# Patient Record
Sex: Male | Born: 1995 | Race: White | Hispanic: No | Smoking: Former smoker
Health system: Southern US, Community
[De-identification: ages and names within clinical notes are randomized; demographics above are authoritative.]

## PROBLEM LIST (undated history)

## (undated) ENCOUNTER — Emergency Department (HOSPITAL_COMMUNITY): Admission: EM | Payer: 59 | Source: Home / Self Care

## (undated) ENCOUNTER — Emergency Department (HOSPITAL_COMMUNITY): Payer: 59 | Source: Home / Self Care

---
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
## 2017-10-11 ENCOUNTER — Emergency Department (HOSPITAL_COMMUNITY)
Admission: EM | Admit: 2017-10-11 | Discharge: 2017-10-11 | Disposition: A | Discharge status: Home / Self Care | Payer: 59 | Attending: Emergency Medicine | Admitting: Emergency Medicine

## 2017-10-11 ENCOUNTER — Encounter (HOSPITAL_COMMUNITY): Payer: Self-pay | Admitting: Emergency Medicine

## 2017-10-11 ENCOUNTER — Emergency Department (HOSPITAL_COMMUNITY): Payer: 59

## 2017-10-11 DIAGNOSIS — R42 Dizziness and giddiness: Secondary | ICD-10-CM | POA: Insufficient documentation

## 2017-10-11 DIAGNOSIS — R0602 Shortness of breath: Secondary | ICD-10-CM | POA: Diagnosis not present

## 2017-10-11 DIAGNOSIS — R200 Anesthesia of skin: Secondary | ICD-10-CM | POA: Insufficient documentation

## 2017-10-11 DIAGNOSIS — R202 Paresthesia of skin: Secondary | ICD-10-CM

## 2017-10-11 DIAGNOSIS — F17210 Nicotine dependence, cigarettes, uncomplicated: Secondary | ICD-10-CM | POA: Insufficient documentation

## 2017-10-11 LAB — CBC WITH DIFFERENTIAL/PLATELET
Basophils Absolute: 0 10*3/uL (ref 0.0–0.1)
Basophils Relative: 0 %
Eosinophils Absolute: 0 10*3/uL (ref 0.0–0.7)
Eosinophils Relative: 0 %
HCT: 46.9 % (ref 39.0–52.0)
Hemoglobin: 16.5 g/dL (ref 13.0–17.0)
Lymphocytes Relative: 9 %
Lymphs Abs: 1.4 10*3/uL (ref 0.7–4.0)
MCH: 32.4 pg (ref 26.0–34.0)
MCHC: 35.2 g/dL (ref 30.0–36.0)
MCV: 92 fL (ref 78.0–100.0)
Monocytes Absolute: 2 10*3/uL — ABNORMAL HIGH (ref 0.1–1.0)
Monocytes Relative: 13 %
Neutro Abs: 11.8 10*3/uL — ABNORMAL HIGH (ref 1.7–7.7)
Neutrophils Relative %: 78 %
Platelets: 245 10*3/uL (ref 150–400)
RBC: 5.1 MIL/uL (ref 4.22–5.81)
RDW: 12.5 % (ref 11.5–15.5)
WBC: 15.3 10*3/uL — ABNORMAL HIGH (ref 4.0–10.5)

## 2017-10-11 LAB — COMPREHENSIVE METABOLIC PANEL
ALT: 23 U/L (ref 0–44)
AST: 25 U/L (ref 15–41)
Albumin: 5.4 g/dL — ABNORMAL HIGH (ref 3.5–5.0)
Alkaline Phosphatase: 69 U/L (ref 38–126)
Anion gap: 15 (ref 5–15)
BUN: 11 mg/dL (ref 6–20)
CO2: 22 mmol/L (ref 22–32)
Calcium: 11.1 mg/dL — ABNORMAL HIGH (ref 8.9–10.3)
Chloride: 106 mmol/L (ref 98–111)
Creatinine, Ser: 1.06 mg/dL (ref 0.61–1.24)
GFR calc Af Amer: >60 mL/min (ref >60)
GFR calc non Af Amer: >60 mL/min (ref >60)
Glucose, Bld: 113 mg/dL — ABNORMAL HIGH (ref 70–99)
Potassium: 3.5 mmol/L (ref 3.5–5.1)
Sodium: 143 mmol/L (ref 135–145)
Total Bilirubin: 1.4 mg/dL — ABNORMAL HIGH (ref 0.3–1.2)
Total Protein: 9.1 g/dL — ABNORMAL HIGH (ref 6.5–8.1)

## 2017-10-11 LAB — ETHANOL: Alcohol, Ethyl (B): <10 mg/dL (ref <10)

## 2017-10-11 LAB — D-DIMER, QUANTITATIVE (NOT AT ARMC): D DIMER QUANT: 0.28 ug{FEU}/mL (ref 0.00–0.50)

## 2017-10-11 LAB — POCT I-STAT TROPONIN I: TROPONIN I, POC: 0 ng/mL (ref 0.00–0.08)

## 2017-10-11 LAB — LIPASE, BLOOD: Lipase: 24 U/L (ref 11–51)

## 2017-10-11 MED ORDER — SODIUM CHLORIDE 0.9 % IV BOLUS
1000.0000 mL | Freq: Once | INTRAVENOUS | Status: AC
  Administered 2017-10-11: 1000 mL via INTRAVENOUS

## 2017-10-11 NOTE — ED Notes (Signed)
ISTAT result not crossing over, patient Troponin resulted 0.00

## 2017-10-11 NOTE — ED Provider Notes (Signed)
[facility]-EMERGENCY DEPT Provider Note   CSN: [id number] Arrival date & time: 10/11/17  7829     History   Chief Complaint Chief Complaint  Patient presents with  . Dizziness  . arm tingling  . Shortness of Breath    HPI Willie Lee is a 22 y.o. male here presenting with dizziness, headaches, numbness, chest pain, shortness of breath.  Patient states that he has been lightheaded and dizzy for the last 3 to 4 weeks.  Patient states that he feels dizzy when he stands up and felt like he is on passed out.  Denies any trouble walking or actual falls.  Patient does admit to drinking some beers occasionally but stopped drinking about a week ago.  Patient also states that he has poor appetite and has not been eating and drinking much but had no vomiting.  He also has some chest pain and subjective shortness of breath and felt like it is hard for him to catch his breath for the last several days.  Moreover since yesterday, he noticed intermittent numbness and tingling in the bilateral hands and forearms.  Patient states that at one point, he had trouble feeling the tips of his fingers and was concerned so came to the ED for evaluation.  Denies any recent travel history of blood clots.  Patient does admit to drinking some alcohol as well as doing some marijuana occasionally. Denies any IV drug use. Patient is otherwise healthy, not taking any medicines regularly.   The history is provided by the patient.    History reviewed. No pertinent past medical history.  There are no active problems to display for this patient.   History reviewed. No pertinent surgical history.      Home Medications    Prior to Admission medications   Medication Sig Start Date End Date Taking? Authorizing Provider  ibuprofen (ADVIL,MOTRIN) 200 MG tablet Take 600 mg by mouth daily as needed for moderate pain.   Yes [provider]  naproxen (NAPROSYN) 500 MG tablet Take 1 tablet  (500 mg total) by mouth 2 (two) times daily. Patient not taking: Reported on 10/11/2017 10/11/14   Jaynie Crumble, PA-C    Family History No family history on file.  Social History Social History   Tobacco Use  . Smoking status: Current Every Day Smoker    Packs/day: 0.50    Types: Cigarettes  . Smokeless tobacco: Never Used  Substance Use Topics  . Alcohol use: Yes  . Drug use: Yes    Types: Marijuana     Allergies   Patient has no known allergies.   Review of Systems Review of Systems  Respiratory: Positive for shortness of breath.   Neurological: Positive for dizziness.  All other systems reviewed and are negative.    Physical Exam Updated Vital Signs BP (!) 142/80   Pulse 65   Temp 98.6 F (37 C) (Oral)   Resp 19   SpO2 98%   Physical Exam  Constitutional: He is oriented to person, place, and time.  Slightly anxious, dehydrated   HENT:  Head: Normocephalic.  MM dry   Eyes: Pupils are equal, round, and reactive to light. EOM are normal.  No obvious nystagmus   Neck: Normal range of motion. Neck supple.  Cardiovascular: Normal rate and regular rhythm.  Pulmonary/Chest: Effort normal and breath sounds normal.  Abdominal: Soft. Bowel sounds are normal.  Musculoskeletal: Normal range of motion.       Right lower leg:  Normal.       Left lower leg: Normal.  Neurological: He is alert and oriented to person, place, and time.  CN 2- 12 intact. Nl strength throughout. Nl finger to nose throughout. Nl gait. ? Slightly dec sensation bilateral hands to the elbow in the glove distribution. 2+ radial pulses, nl capillary refill   Skin: Skin is warm. Capillary refill takes less than 2 seconds.  Psychiatric: He has a normal mood and affect. His behavior is normal.  Nursing note and vitals reviewed.    ED Treatments / Results  Labs (all labs ordered are listed, but only abnormal results are displayed) Labs Reviewed  CBC WITH DIFFERENTIAL/PLATELET - Abnormal;  Notable for the following components:      Result Value   WBC 15.3 (*)    Neutro Abs 11.8 (*)    Monocytes Absolute 2.0 (*)    All other components within normal limits  COMPREHENSIVE METABOLIC PANEL - Abnormal; Notable for the following components:   Glucose, Bld 113 (*)    Calcium 11.1 (*)    Total Protein 9.1 (*)    Albumin 5.4 (*)    Total Bilirubin 1.4 (*)    All other components within normal limits  D-DIMER, QUANTITATIVE (NOT AT [facility])  ETHANOL  LIPASE, BLOOD  I-STAT TROPONIN, ED  POCT I-STAT TROPONIN I    EKG EKG Interpretation  Date/Time:  Monday October 11 2017 09:36:21 EDT Ventricular Rate:  99 PR Interval:    QRS Duration: 74 QT Interval:  357 QTC Calculation: 459 R Axis:   71 Text Interpretation:  Sinus rhythm LVH by voltage Repol abnrm, global ischemia, diffuse leads No previous ECGs available Confirmed by Richardean Canal [phone]) on 10/11/2017 10:55:05 AM   Radiology Dg Chest 2 View  Result Date: 10/11/2017 CLINICAL DATA:  Dizziness for approx 1 month with new onset arms tingling, sob over last few days, recently quit smoking EXAM: CHEST - 2 VIEW COMPARISON:  08/19/2005 FINDINGS: Normal heart, mediastinum and hila. The lungs are clear.  No pleural effusion or pneumothorax. Skeletal structures are within normal limits. IMPRESSION: Normal chest radiographs. Electronically Signed   By: Amie Portland M.D.   On: 10/11/2017 09:46   Ct Head Wo Contrast  Result Date: 10/11/2017 CLINICAL DATA:  Vertigo EXAM: CT HEAD WITHOUT CONTRAST TECHNIQUE: Contiguous axial images were obtained from the base of the skull through the vertex without intravenous contrast. COMPARISON:  None. FINDINGS: Brain: No evidence of acute infarction, hemorrhage, hydrocephalus, extra-axial collection or mass lesion/mass effect. Vascular: No hyperdense vessel or unexpected calcification. Skull: No osseous abnormality. Sinuses/Orbits: Visualized paranasal sinuses are clear. Visualized mastoid sinuses are  clear. Visualized orbits demonstrate no focal abnormality. Other: None IMPRESSION: No acute intracranial pathology. Electronically Signed   By: Elige Ko   On: 10/11/2017 12:32    Procedures Procedures (including critical care time)  Medications Ordered in ED Medications  sodium chloride 0.9 % bolus 1,000 mL (1,000 mLs Intravenous New Bag/Given 10/11/17 1148)     Initial Impression / Assessment and Plan / ED Course  I have reviewed the triage vital signs and the nursing notes.  Pertinent labs & imaging results that were available during my care of the patient were reviewed by me and considered in my medical decision making (see chart for details).     Willie Lee is a 22 y.o. male here with dizziness, light headedness, numbness, shortness of breath. He has constellation of symptoms currently. Consider new onset diabetes vs brain mass  vs alcohol withdrawal vs orthostasis. Will check labs, CT head. Will hydrate and reassess.   1:07 PM WBC 15. But CT head and CXR clear. ETOH negative. Feels better with IVF. I don't know why he has parethesias that seemed to resolve. Glucose 113. Stable for discharge. Told him to avoid drinking alcohol and smoking cigarettes.    Final Clinical Impressions(s) / ED Diagnoses   Final diagnoses:  None    ED Discharge Orders    None       Charlynne Pander, MD 10/11/17 1308

## 2017-10-11 NOTE — ED Triage Notes (Signed)
Pt reports that for couple days he been having dizziness and lightheadedness. Yesterday arournd 5pm started having tingling in bilat hands and arms up to elbows, with SOB.

## 2017-10-11 NOTE — Discharge Instructions (Addendum)
Stay hydrated.   Avoid alcohol and smoking cigarettes.   See neurology if you have worse numbness in your hands   See your doctor  Return to ER if you have worse numbness, weakness, headaches, trouble breathing.

## 2018-01-05 ENCOUNTER — Other Ambulatory Visit: Payer: Self-pay

## 2018-01-05 ENCOUNTER — Encounter (HOSPITAL_COMMUNITY): Payer: Self-pay

## 2018-01-05 ENCOUNTER — Emergency Department (HOSPITAL_COMMUNITY)
Admission: EM | Admit: 2018-01-05 | Discharge: 2018-01-05 | Disposition: A | Discharge status: Home / Self Care | Payer: 59 | Attending: Emergency Medicine | Admitting: Emergency Medicine

## 2018-01-05 DIAGNOSIS — Z5321 Procedure and treatment not carried out due to patient leaving prior to being seen by health care provider: Secondary | ICD-10-CM | POA: Insufficient documentation

## 2018-01-05 DIAGNOSIS — R5383 Other fatigue: Secondary | ICD-10-CM | POA: Diagnosis not present

## 2018-01-05 NOTE — ED Notes (Signed)
Patient approached the nurse's station stating that he would rather just leave instead of waiting for testing. Patient seen leaving the emergency department by multiple staff members.

## 2018-01-05 NOTE — ED Triage Notes (Signed)
Pt states that he recently quit smoking, and has been coughing up dark sputum. Pt states that today he noticed some brighter red in his phlegm. Pt also reports fatigue.

## 2018-01-11 DIAGNOSIS — R42 Dizziness and giddiness: Secondary | ICD-10-CM | POA: Diagnosis not present

## 2018-01-11 DIAGNOSIS — F411 Generalized anxiety disorder: Secondary | ICD-10-CM | POA: Diagnosis not present

## 2018-01-11 DIAGNOSIS — R5383 Other fatigue: Secondary | ICD-10-CM | POA: Diagnosis not present

## 2018-01-11 DIAGNOSIS — R7309 Other abnormal glucose: Secondary | ICD-10-CM | POA: Diagnosis not present

## 2018-01-11 DIAGNOSIS — J988 Other specified respiratory disorders: Secondary | ICD-10-CM | POA: Diagnosis not present

## 2018-01-11 DIAGNOSIS — R202 Paresthesia of skin: Secondary | ICD-10-CM | POA: Diagnosis not present

## 2018-02-10 DIAGNOSIS — D72829 Elevated white blood cell count, unspecified: Secondary | ICD-10-CM | POA: Diagnosis not present

## 2018-03-14 ENCOUNTER — Ambulatory Visit: Payer: 59 | Admitting: Internal Medicine

## 2018-03-14 ENCOUNTER — Encounter: Payer: Self-pay | Admitting: Internal Medicine

## 2018-03-14 ENCOUNTER — Ambulatory Visit (INDEPENDENT_AMBULATORY_CARE_PROVIDER_SITE_OTHER)
Admission: RE | Admit: 2018-03-14 | Discharge: 2018-03-14 | Disposition: A | Discharge status: Home / Self Care | Payer: 59 | Source: Ambulatory Visit | Attending: Internal Medicine | Admitting: Internal Medicine

## 2018-03-14 VITALS — BP 114/64 | HR 94 | Ht 70.0 in | Wt 188.6 lb

## 2018-03-14 DIAGNOSIS — R053 Chronic cough: Secondary | ICD-10-CM

## 2018-03-14 DIAGNOSIS — R0789 Other chest pain: Secondary | ICD-10-CM | POA: Diagnosis not present

## 2018-03-14 DIAGNOSIS — R05 Cough: Secondary | ICD-10-CM

## 2018-03-14 NOTE — Progress Notes (Signed)
Willie Lee, male    DOB: July 25, 1995,    MRN: [id number]   Brief patient profile:  23 yowm very healthy kid, good runner and swimmer stopped age 23 when started smoking cigs/MJ and Sept 2019 tried to run 2 miles then after running felt tingly /light headed > ER 10/11/17 clinical dx dedhydration and tingling went away but then chonic cough with grey mucus rx zpak /blood work "ok" and grey mucus only resolved transiently and despite the decision to stop cigarettes 10/12/17 continued with dark mucus until stopped MJ last week in Feb 2019 and mucus less dark since, occ slt blood streak so referred to pulmonary clinic 03/14/2018 by Dr   Willie Lee      History of Present Illness  03/14/2018  Pulmonary/ 1st office eval/Willie Lee  Chief Complaint  Patient presents with  . Pulmonary Consult    Referred by Dr. Farris Lee. Pt c/o cough with dark grey sputum x 4 months. He c/o CP off and on since Jan 2020- mainly occurs in the am when he coughs up sputum.   Dyspnea:  Jumpy jacks q am / no real aerobics  Cough: mucus is still grey esp in am x one tsb but no longer bloody but assoc with bilateral ant cp with coughing fits  Sleep: able to sleep on side /  flat bed with one pillow SABA use: none    No obvious day to day or daytime variability or assoc excess/ purulent sputum or mucus plugs or hemoptysis or   chest tightness, subjective wheeze or overt sinus or hb symptoms.   Sleeping as above  without nocturnal  or early am exacerbation  of respiratory  c/o's or need for noct saba. Also denies any obvious fluctuation of symptoms with weather or environmental changes or other aggravating or alleviating factors except as outlined above   No unusual exposure hx or h/o childhood pna/ asthma or knowledge of premature birth.  Current Allergies, Complete Past Medical History, Past Surgical History, Family History, and Social History were reviewed in [employer] record.  ROS  The following  are not active complaints unless bolded Hoarseness, sore throat, dysphagia, dental problems, itching, sneezing,  nasal congestion or discharge of excess mucus or purulent secretions, ear ache,   fever, chills, sweats, unintended wt loss or wt gain, classically pleuritic or exertional cp,  orthopnea pnd or arm/hand swelling  or leg swelling, presyncope, palpitations, abdominal pain, anorexia, nausea, vomiting, diarrhea  or change in bowel habits or change in bladder habits, change in stools or change in urine, dysuria, hematuria,  rash, arthralgias, visual complaints, headache, numbness, weakness or ataxia or problems with walking or coordination,  change in mood or  memory.           No past medical history on file.  Outpatient Medications Prior to Visit  Medication Sig Dispense Refill  . ibuprofen (ADVIL,MOTRIN) 200 MG tablet Take 600 mg by mouth daily as needed for moderate pain.    . naproxen (NAPROSYN) 500 MG tablet Take 1 tablet (500 mg total) by mouth 2 (two) times daily. (Patient not taking: Reported on 10/11/2017) 30 tablet 0      Objective:     BP 114/64 (BP Location: Left Arm, Cuff Size: Normal)   Pulse 94   Ht 5\' 10"  (1.778 m)   Wt 188 lb 9.6 oz (85.5 kg)   SpO2 99%   BMI 27.06 kg/m   SpO2: 99 % RA   HEENT: nl  dentition, turbinates bilaterally, and oropharynx. Nl external ear canals without cough reflex   NECK :  without JVD/Nodes/TM/ nl carotid upstrokes bilaterally   LUNGS: no acc muscle use,  Nl contour chest which is clear to A and P bilaterally without cough on insp or exp maneuvers   CV:  RRR  no s3 or murmur or increase in P2, and no edema   ABD:  soft and nontender with nl inspiratory excursion in the supine position. No bruits or organomegaly appreciated, bowel sounds nl  MS:  Nl gait/ ext warm without deformities, calf tenderness, cyanosis or clubbing No obvious joint restrictions   SKIN: warm and dry without lesions    NEURO:  alert, approp, nl  sensorium with  no motor or cerebellar deficits apparent.      CXR PA and Lateral:   03/14/2018 :    I personally reviewed images and agree with radiology impression as follows:    Slightly accentuated perihilar interstitium, can be seen with reactive airways. No focal pulmonary infiltrate.   Sinus CT reviewed from 10/11/17 as part of head ct and wnl   Labs reviewed:     Chemistry      Component Value Date/Time   NA 143 10/11/2017 1149   K 3.5 10/11/2017 1149   CL 106 10/11/2017 1149   CO2 22 10/11/2017 1149   BUN 11 10/11/2017 1149   CREATININE 1.06 10/11/2017 1149      Component Value Date/Time   CALCIUM 11.1 (H) 10/11/2017 1149   ALKPHOS 69 10/11/2017 1149   AST 25 10/11/2017 1149   ALT 23 10/11/2017 1149   BILITOT 1.4 (H) 10/11/2017 1149        Lab Results  Component Value Date   WBC 15.3 (H) 10/11/2017   HGB 16.5 10/11/2017   HCT 46.9 10/11/2017   MCV 92.0 10/11/2017   PLT 245 10/11/2017       EOS                                                              0                                        01/13/18   Lab Results  Component Value Date   DDIMER 0.28 10/11/2017        Calcium repeated 01/13/18 by Dr Willie Lee and 10.7 but albumin was 5.3      Assessment   Chronic cough Quit smoking 10/12/17 and quit MJ last week in Feb 2020  - Spirometry 03/14/2018  FEV1 4.6 (99%)  Ratio 0.72 no curvature in effort indep portion off all rx     The most common causes of chronic cough in immunocompetent adults include the following: upper airway cough syndrome (UACS), previously referred to as postnasal drip syndrome (PNDS), which is caused by variety of rhinosinus conditions; (2) asthma; (3) GERD; (4) chronic bronchitis from cigarette smoking or other inhaled environmental irritants (MJ in this case) ; (5) nonasthmatic eosinophilic bronchitis; and (6) bronchiectasis.   These conditions, singly or in combination, have accounted for up to 94% of the causes of chronic cough in  prospective studies.   Other conditions have constituted  no >6% of the causes in prospective studies These have included bronchogenic carcinoma, chronic interstitial pneumonia, sarcoidosis, left ventricular failure, ACEI-induced cough, and aspiration from a condition associated with pharyngeal dysfunction.    Chronic cough is often simultaneously caused by more than one condition. A single cause Lee been found from 38 to 82% of the time, multiple causes from 18 to 62%. Multiply caused cough Lee been the result of three diseases up to 42% of the time.    Reviewed with pt effects of cigs/MJ on mucociliary function using the escalator analogy and emphasizing the impt of breathing clean air over next 4-6 weeks and if still coughing then need re-eval  Also reviewed importance of coiugh  inducing secondary gerd and cyclical cough so gave him diet to follow but no need for ppi yet.  Also: >   I reviewed the Fletcher curve with the patient that basically indicates  if you quit smoking when your best day FEV1 is still well preserved (as is clearly  the case here)  it is highly unlikely you will progress to severe disease and informed the patient there was  no medication on the market that Lee proven to alter the curve/ its downward trajectory  or the likelihood of progression of their disease   Therefore    maintaining abstinence are  the most important aspects of his care, not choice of inhalers or for that matter, doctors.   Treatment other than smoking cessation  is entirely directed by severity of symptoms and focused also on reducing exacerbations, not attempting to change the natural history of the disease.    >>>> Prognosis would appear to be excellent if he maintains off cigs and f/u is prn   MSCP secondary to cough  Classic pattern related to cough so should subside as cough resolves/ otherwise f/u here advised        Total time devoted to counseling  > 50 % of initial 60 min office visit:   review case with pt/ discussion of options/alternatives/ personally creating written customized instructions  in presence of pt  then going over those specific  Instructions directly with the pt including how to use all of the meds but in particular covering each new medication in detail and the difference between the maintenance= "automatic" meds and the prns using an action plan format for the latter (If this problem/symptom => do that organization reading Left to right).  Please see AVS from this visit for a full list of these instructions which I personally wrote for this pt and  are unique to this visit.     Sandrea Hughs, MD 03/14/2018

## 2018-03-14 NOTE — Patient Instructions (Addendum)
For cough > mucinex dm 1200mg  every 12 hours  As needed   GERD (REFLUX)  is an extremely common cause of respiratory symptoms just like yours , many times with no obvious heartburn at all.    It can be treated with medication, but also with lifestyle changes including elevation of the head of your bed (ideally with 6 -8inch blocks under the headboard of your bed),  Smoking cessation, avoidance of late meals, excessive alcohol, and avoid fatty foods, chocolate, peppermint, colas, red wine, and acidic juices such as orange juice.  NO MINT OR MENTHOL PRODUCTS SO NO COUGH DROPS  USE SUGARLESS CANDY INSTEAD (Jolley ranchers or Stover's or Life Savers) or even ice chips will also do - the key is to swallow to prevent all throat clearing. NO OIL BASED VITAMINS - use powdered substitutes.  Avoid fish oil when coughing.    Please remember to go to the  x-ray department  for your tests - we will call you with the results when they are available    Congratulations on your smoking cessation - it's the most important aspect of your care!   If not all better in 5 weeks please return to office

## 2018-03-15 ENCOUNTER — Encounter: Payer: Self-pay | Admitting: Internal Medicine

## 2018-03-15 DIAGNOSIS — R0789 Other chest pain: Secondary | ICD-10-CM | POA: Insufficient documentation

## 2018-03-15 NOTE — Telephone Encounter (Signed)
Patient sent a message complaining of nose bleed last night that lasted .  Message from Patient  It lasted 10 min it happened twice last night and hasn't happened since. It was just blood no mucus I think it is because Mucinex D has nasal decongestant in it. I put Vaseline in my nose with a qtip and it stoped the bleeding  Message routed to Dr Sherene Sires

## 2018-03-15 NOTE — Assessment & Plan Note (Addendum)
Quit smoking 10/12/17 and quit MJ last week in Feb 2020  - Spirometry 03/14/2018  FEV1 4.6 (99%)  Ratio 0.72 no curvature in effort indep portion off all rx     The most common causes of chronic cough in immunocompetent adults include the following: upper airway cough syndrome (UACS), previously referred to as postnasal drip syndrome (PNDS), which is caused by variety of rhinosinus conditions; (2) asthma; (3) GERD; (4) chronic bronchitis from cigarette smoking or other inhaled environmental irritants (MJ in this case) ; (5) nonasthmatic eosinophilic bronchitis; and (6) bronchiectasis.   These conditions, singly or in combination, have accounted for up to 94% of the causes of chronic cough in prospective studies.   Other conditions have constituted no >6% of the causes in prospective studies These have included bronchogenic carcinoma, chronic interstitial pneumonia, sarcoidosis, left ventricular failure, ACEI-induced cough, and aspiration from a condition associated with pharyngeal dysfunction.    Chronic cough is often simultaneously caused by more than one condition. A single cause has been found from 38 to 82% of the time, multiple causes from 18 to 62%. Multiply caused cough has been the result of three diseases up to 42% of the time.    Reviewed with pt effects of cigs/MJ on mucociliary function using the escalator analogy and emphasizing the impt of breathing clean air over next 4-6 weeks and if still coughing then need re-eval  Also reviewed importance of coiugh  inducing secondary gerd and cyclical cough so gave him diet to follow but no need for ppi yet.  Also: >   I reviewed the Fletcher curve with the patient that basically indicates  if you quit smoking when your best day FEV1 is still well preserved (as is clearly  the case here)  it is highly unlikely you will progress to severe disease and informed the patient there was  no medication on the market that has proven to alter the curve/ its  downward trajectory  or the likelihood of progression of their disease   Therefore    maintaining abstinence are  the most important aspects of his care, not choice of inhalers or for that matter, doctors.   Treatment other than smoking cessation  is entirely directed by severity of symptoms and focused also on reducing exacerbations, not attempting to change the natural history of the disease.    >>>> Prognosis would appear to be excellent if he maintains off cigs and f/u is prn    Total time devoted to counseling  > 50 % of initial 60 min office visit:  review case with pt/ discussion of options/alternatives/ personally creating written customized instructions  in presence of pt  then going over those specific  Instructions directly with the pt including how to use all of the meds but in particular covering each new medication in detail and the difference between the maintenance= "automatic" meds and the prns using an action plan format for the latter (If this problem/symptom => do that organization reading Left to right).  Please see AVS from this visit for a full list of these instructions which I personally wrote for this pt and  are unique to this visit.

## 2018-03-15 NOTE — Telephone Encounter (Signed)
That's not normal at all and I would suggest a sinus CT or refer to ENT and let them decide whether Ct is needed at that point, whichever he prefers

## 2018-03-15 NOTE — Assessment & Plan Note (Signed)
Classic pattern related to cough so should subside as cough resolves/ otherwise f/u here advised

## 2018-03-15 NOTE — Progress Notes (Signed)
Spoke with pt and notified of results per Dr. Wert. Pt verbalized understanding and denied any questions. 

## 2018-06-27 ENCOUNTER — Telehealth: Payer: 59 | Admitting: Family

## 2018-06-27 DIAGNOSIS — R059 Cough, unspecified: Secondary | ICD-10-CM

## 2018-06-27 DIAGNOSIS — R05 Cough: Secondary | ICD-10-CM

## 2018-06-27 NOTE — Progress Notes (Signed)
Based on what you shared with me, I feel your condition warrants further evaluation and I recommend that you be seen for a face to face office visit.  NOTE: If you entered your credit card information for this eVisit, you will not be charged. You may see a "hold" on your card for the $35 but that hold will drop off and you will not have a charge processed.  Given that this cough is your recurrent and you have already seen a pulmonologist, I recommend follow up with your PCP or  pulmonologist office for follow up.  If you are having a true medical emergency please call 911.     For an urgent face to face visit, [facility] has five urgent care centers for your convenience:    [email or web address] to reserve your spot online an avoid wait times  [facility] [address], [address] [place], [place] [address] Modified hours of operation: Monday-Friday, 12 PM to 6 PM  Closed Saturday & Sunday  *Across the street from [place] (New Address!) [address] [place], [place] [address] *Just off [employer], across the road from [place] hours of operation: Monday-Friday, 12 PM to 6 PM  Closed Saturday & Sunday   The following sites will take your insurance:  . [facility]    [phone]                  Get Driving Directions  [address], [place] [address] . 10 am to 8 pm Monday-Friday . 12 pm to 8 pm Saturday-Sunday   . [facility] at [place]                  Get Driving Directions  [address], [place], [place] [address] . 8 am to 8 pm Monday-Friday . 9 am to 6 pm Saturday . 11 am to 6 pm Sunday   . [facility] at [place]                  Get Driving Directions   [address] [place],  [address] . 8 am to 8 pm Monday-Friday . 8 am to 4 pm  Saturday-Sunday    . [facility] at [place]                    Get Driving Directions  [phone]  [address] [place],  [address]  . Monday-Friday, 12 PM to 6 PM    Your e-visit answers were reviewed by a board certified advanced clinical practitioner to complete your personal care plan.  Thank you for using e-Visits.

## 2018-06-28 ENCOUNTER — Telehealth: Payer: Self-pay | Admitting: Internal Medicine

## 2018-06-28 DIAGNOSIS — R05 Cough: Secondary | ICD-10-CM

## 2018-06-28 DIAGNOSIS — R053 Chronic cough: Secondary | ICD-10-CM

## 2018-06-28 NOTE — Telephone Encounter (Signed)
Called and spoke with Patient.  Dr Melvyn Novas recommendations given.  Understanding stated. Sputum order placed.  Patient aware to come pick up sputum cup and drop back off.  Patient scheduled for follow up with Dr Melvyn Novas, 07/04/18, at 1:45pm. Cup placed at front door for pick up today.  Nothing further at this time.

## 2018-06-28 NOTE — Telephone Encounter (Signed)
SPoke with pt, he would like to do a sputum culture because he has been having problems with his mucus since Christmas. He stopped smoking in October and he is still having issues with his mucus where is has dark gray, black specks in it. He gets dizzy and nauseous and thinks he may have a bacterial infection. MW please advise if we can put orders in for sputum cultures.     Patient Instructions by Tanda Rockers, MD at 03/14/2018 4:00 PM Author: Tanda Rockers, MD Author Type: Physician Filed: 03/14/2018 4:36 PM  Note Status: Addendum Mickle Mallory: Cosign Not Required Encounter Date: 03/14/2018  Editor: Tanda Rockers, MD (Physician)  Prior Versions: 1. Tanda Rockers, MD (Physician) at 03/14/2018 4:33 PM - Signed    For cough > mucinex dm 1200mg  every 12 hours  As needed   GERD (REFLUX)  is an extremely common cause of respiratory symptoms just like yours , many times with no obvious heartburn at all.    It can be treated with medication, but also with lifestyle changes including elevation of the head of your bed (ideally with 6 -8inch blocks under the headboard of your bed),  Smoking cessation, avoidance of late meals, excessive alcohol, and avoid fatty foods, chocolate, peppermint, colas, red wine, and acidic juices such as orange juice.  NO MINT OR MENTHOL PRODUCTS SO NO COUGH DROPS  USE SUGARLESS CANDY INSTEAD (Jolley ranchers or Stover's or Life Savers) or even ice chips will also do - the key is to swallow to prevent all throat clearing. NO OIL BASED VITAMINS - use powdered substitutes.  Avoid fish oil when coughing.    Please remember to go to the  x-ray department  for your tests - we will call you with the results when they are available    Congratulations on your smoking cessation - it's the most important aspect of your care!   If not all better in 5 weeks please return to office

## 2018-06-28 NOTE — Telephone Encounter (Signed)
Ok but really needs ov with all meds in hand if wants me to treat him effectively - perhaps 72 h p he turns in sputum sample would be ideal so we can regroup

## 2018-06-29 ENCOUNTER — Other Ambulatory Visit: Payer: 59

## 2018-06-29 DIAGNOSIS — R05 Cough: Secondary | ICD-10-CM

## 2018-06-29 DIAGNOSIS — R053 Chronic cough: Secondary | ICD-10-CM

## 2018-07-02 LAB — RESPIRATORY CULTURE OR RESPIRATORY AND SPUTUM CULTURE
MICRO NUMBER:: 579322
RESULT:: NORMAL
SPECIMEN QUALITY:: ADEQUATE

## 2018-07-04 ENCOUNTER — Encounter: Payer: Self-pay | Admitting: Internal Medicine

## 2018-07-04 ENCOUNTER — Ambulatory Visit (INDEPENDENT_AMBULATORY_CARE_PROVIDER_SITE_OTHER): Payer: 59 | Admitting: Internal Medicine

## 2018-07-04 ENCOUNTER — Other Ambulatory Visit: Payer: Self-pay

## 2018-07-04 DIAGNOSIS — R0789 Other chest pain: Secondary | ICD-10-CM | POA: Diagnosis not present

## 2018-07-04 DIAGNOSIS — R05 Cough: Secondary | ICD-10-CM

## 2018-07-04 DIAGNOSIS — R053 Chronic cough: Secondary | ICD-10-CM

## 2018-07-04 NOTE — Assessment & Plan Note (Signed)
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 Frequently these patients have had multiple negative GI workups and CT scans.  Treatment consists of avoiding foods that cause gas (especially boiled eggs, mexcican food but especially  beans and undercooked vegetables like  spinach and some salads)  and citrucel 1 heaping tsp twice daily with a large glass of water.  Pain should improve w/in 2 weeks and if not then consider further GI work up.      Pulmonary f/u is prn

## 2018-07-04 NOTE — Progress Notes (Addendum)
Willie Lee, male    DOB: 1995/12/07,    MRN: [id number]   Brief patient profile:  23 yowm very healthy kid, good runner and swimmer stopped age 23 when started smoking cigs/MJ and Sept 2019 tried to run 2 miles then after running felt tingly /light headed > ER 10/11/17 clinical dx dedhydration and tingling went away but then chonic cough with grey mucus rx zpak /blood work "ok" and grey mucus only resolved transiently and despite the decision to stop cigarettes 10/12/17 continued with dark mucus until stopped MJ last week in Feb 2019 and mucus less dark since, occ slt blood streak so referred to pulmonary clinic 03/14/2018 by Dr   [name].      History of Present Illness  03/14/2018  Pulmonary/ 1st office eval/[name]  Chief Complaint  Patient presents with  . Pulmonary Consult    Referred by Dr. [name]. Pt c/o cough with dark grey sputum x 4 months. He c/o CP off and on since Jan 2020- mainly occurs in the am when he coughs up sputum.   Dyspnea:  Jumpy jacks q am / no real aerobics  Cough: mucus is still grey esp in am x one tsb but no longer bloody but assoc with bilateral ant cp with coughing fits  Sleep: able to sleep on side /  flat bed with one pillow rec For cough > mucinex dm 1200mg  every 12 hours  As needed GERD diet    07/04/2018  f/u ov/[name] re: cough since Sept 2019 / mucinex d x  one dose only  Chief Complaint  Patient presents with  . Follow-up    Still has some chest discomfort occ on both sides.   Dyspnea:  Not limited / started jogging  Cough: gone though still with sensation of too much throat mucus  Sleeping: propped up sleeps ok  SABA use: none 02: none  CP  Migrates from side to side only in sitting position, never supine   No obvious day to day or daytime variability or assoc excess/ purulent sputum or mucus plugs or hemoptysis  or chest tightness, subjective wheeze or overt sinus or hb symptoms.   Sleeping as above  without nocturnal  or early am  exacerbation  of respiratory  c/o's or need for noct saba. Also denies any obvious fluctuation of symptoms with weather or environmental changes or other aggravating or alleviating factors except as outlined above   No unusual exposure hx or h/o childhood pna/ asthma or knowledge of premature birth.  Current Allergies, Complete Past Medical History, Past Surgical History, Family History, and Social History were reviewed in [employer] record.  ROS  The following are not active complaints unless bolded Hoarseness, sore throat, dysphagia, dental problems, itching, sneezing,  nasal congestion or discharge of excess mucus or purulent secretions, ear ache,   fever, chills, sweats, unintended wt loss or wt gain, classically pleuritic or exertional cp,  orthopnea pnd or arm/hand swelling  or leg swelling, presyncope, palpitations, abdominal pain, anorexia, nausea, vomiting, diarrhea  or change in bowel habits or change in bladder habits, change in stools or change in urine, dysuria, hematuria,  rash, arthralgias, visual complaints, headache, numbness, weakness or ataxia or problems with walking or coordination,  change in mood or  memory.        No outpatient medications              Objective:     Wt Readings from Last 3 Encounters:  07/04/18 190 lb (86.2 kg)  03/14/18 188 lb 9.6 oz (85.5 kg)  01/05/18 185 lb (83.9 kg)     Vital signs reviewed - Note on arrival 02 sats  95% on RA     Somber amb wm nad  HEENT: nl dentition, turbinates bilaterally, and oropharynx. Nl external ear canals without cough reflex   NECK :  without JVD/Nodes/TM/ nl carotid upstrokes bilaterally   LUNGS: no acc muscle use,  Nl contour chest which is clear to A and P bilaterally without cough on insp or exp maneuvers   CV:  RRR  no s3 or murmur or increase in P2, and no edema   ABD:  soft and nontender with nl inspiratory excursion in the supine position. No bruits or organomegaly  appreciated, bowel sounds nl  MS:  Nl gait/ ext warm without deformities, calf tenderness, cyanosis or clubbing No obvious joint restrictions   SKIN: warm and dry without lesions    NEURO:  alert, approp, nl sensorium with  no motor or cerebellar deficits apparent.         Assessment

## 2018-07-04 NOTE — Assessment & Plan Note (Signed)
Quit smoking 10/12/17 and quit MJ last week in Feb 2020  - Spirometry 03/14/2018  FEV1 4.6 (99%)  Ratio 0.72 no curvature in effort indep portion off all rx     > 3 min discussion I reviewed the [place] curve with the patient that basically indicates  if you quit smoking when your best day FEV1 is still well preserved (as is clearly  the case here)  it is highly unlikely you will progress to severe disease and informed the patient there was  no medication on the market that has proven to alter the curve/ its downward trajectory  or the likelihood of progression of their disease(unlike other chronic medical conditions such as atheroclerosis where we do think we can change the natural hx with risk reducing meds)    Therefore at this point maintaining abstinence is the most important aspects of his care, not choice of inhalers or for that matter, doctors.   Treatment other than smoking cessation  is entirely directed by severity of symptoms and focused also on reducing exacerbations, not attempting to change the natural history of the disease.

## 2018-07-04 NOTE — Patient Instructions (Addendum)
Classic subdiaphragmatic pain pattern suggests ibs:  Stereotypical, migratory with a very limited distribution of pain locations, daytime, not usually exacerbated by exercise  or coughing, worse in sitting position, frequently associated with generalized abd bloating, not as likely to be present supine due to the dome effect of the diaphragm which  is  canceled in that position. Frequently these patients have had multiple negative GI workups and CT scans.  Treatment consists of avoiding foods that cause gas (especially boiled eggs, mexcican food but especially  beans and undercooked vegetables like  spinach and some salads)  and citrucel 1 heaping tsp twice daily with a large glass of water.  Pain should improve w/in 2 weeks and if not then consider further GI work up.      Follow up is as needed

## 2018-08-11 ENCOUNTER — Telehealth: Payer: 59 | Admitting: Family

## 2018-08-11 DIAGNOSIS — R05 Cough: Secondary | ICD-10-CM

## 2018-08-11 DIAGNOSIS — R059 Cough, unspecified: Secondary | ICD-10-CM

## 2018-08-11 MED ORDER — PREDNISONE 10 MG (21) PO TBPK: ORAL_TABLET | ORAL | 0 refills | Status: DC

## 2018-08-11 NOTE — Progress Notes (Signed)
We are sorry that you are not feeling well.  Here is how we plan to help!  Based on your presentation I believe you most likely have A cough due to a virus.  This is called viral bronchitis and is best treated by rest, plenty of fluids and control of the cough.  You may use Ibuprofen or Tylenol as directed to help your symptoms.     In addition you may use A non-prescription cough medication called Robitussin DAC. Take 2 teaspoons every 8 hours or Delsym: take 2 teaspoons every 12 hours.  Prednisone 10 mg daily for 6 days (see taper instructions below)  Directions for 6 day taper: Day 1: 2 tablets before breakfast, 1 after both lunch & dinner and 2 at bedtime Day 2: 1 tab before breakfast, 1 after both lunch & dinner and 2 at bedtime Day 3: 1 tab at each meal & 1 at bedtime Day 4: 1 tab at breakfast, 1 at lunch, 1 at bedtime Day 5: 1 tab at breakfast & 1 tab at bedtime Day 6: 1 tab at breakfast   From your responses in the eVisit questionnaire you describe inflammation in the upper respiratory tract which is causing a significant cough.  This is commonly called Bronchitis and has four common causes:    Allergies  Viral Infections  Acid Reflux  Bacterial Infection Allergies, viruses and acid reflux are treated by controlling symptoms or eliminating the cause. An example might be a cough caused by taking certain blood pressure medications. You stop the cough by changing the medication. Another example might be a cough caused by acid reflux. Controlling the reflux helps control the cough.  USE OF BRONCHODILATOR ("RESCUE") INHALERS: There is a risk from using your bronchodilator too frequently.  The risk is that over-reliance on a medication which only relaxes the muscles surrounding the breathing tubes can reduce the effectiveness of medications prescribed to reduce swelling and congestion of the tubes themselves.  Although you feel brief relief from the bronchodilator inhaler, your asthma  may actually be worsening with the tubes becoming more swollen and filled with mucus.  This can delay other crucial treatments, such as oral steroid medications. If you need to use a bronchodilator inhaler daily, several times per day, you should discuss this with your provider.  There are probably better treatments that could be used to keep your asthma under control.     HOME CARE . Only take medications as instructed by your medical team. . Complete the entire course of an antibiotic. . Drink plenty of fluids and get plenty of rest. . Avoid close contacts especially the very young and the elderly . Cover your mouth if you cough or cough into your sleeve. . Always remember to wash your hands . A steam or ultrasonic humidifier can help congestion.   GET HELP RIGHT AWAY IF: . You develop worsening fever. . You become short of breath . You cough up blood. . Your symptoms persist after you have completed your treatment plan MAKE SURE YOU   Understand these instructions.  Will watch your condition.  Will get help right away if you are not doing well or get worse.  Your e-visit answers were reviewed by a board certified advanced clinical practitioner to complete your personal care plan.  Depending on the condition, your plan could have included both over the counter or prescription medications. If there is a problem please reply  once you have received a response from your provider. Your   safety is important to us.  If you have drug allergies check your prescription carefully.    You can use MyChart to ask questions about today's visit, request a non-urgent call back, or ask for a work or school excuse for 24 hours related to this e-Visit. If it has been greater than 24 hours you will need to follow up with your provider, or enter a new e-Visit to address those concerns. You will get an e-mail in the next two days asking about your experience.  I hope that your e-visit has been valuable and  will speed your recovery. Thank you for using e-visits. Greater than 5 minutes, yet less than 10 minutes of time have been spent researching, coordinating, and implementing care for this patient today.  Thank you for the details you included in the comment boxes. Those details are very helpful in determining the best course of treatment for you and help us to provide the best care.  

## 2018-09-17 ENCOUNTER — Emergency Department (HOSPITAL_COMMUNITY)
Admission: EM | Admit: 2018-09-17 | Discharge: 2018-09-17 | Disposition: A | Discharge status: Home / Self Care | Payer: 59 | Attending: Emergency Medicine | Admitting: Emergency Medicine

## 2018-09-17 ENCOUNTER — Emergency Department (HOSPITAL_COMMUNITY): Payer: 59

## 2018-09-17 ENCOUNTER — Encounter (HOSPITAL_COMMUNITY): Payer: Self-pay

## 2018-09-17 ENCOUNTER — Other Ambulatory Visit: Payer: Self-pay

## 2018-09-17 DIAGNOSIS — Y999 Unspecified external cause status: Secondary | ICD-10-CM | POA: Insufficient documentation

## 2018-09-17 DIAGNOSIS — S6991XA Unspecified injury of right wrist, hand and finger(s), initial encounter: Secondary | ICD-10-CM | POA: Diagnosis present

## 2018-09-17 DIAGNOSIS — Y929 Unspecified place or not applicable: Secondary | ICD-10-CM | POA: Insufficient documentation

## 2018-09-17 DIAGNOSIS — S62392A Other fracture of third metacarpal bone, right hand, initial encounter for closed fracture: Secondary | ICD-10-CM | POA: Diagnosis not present

## 2018-09-17 DIAGNOSIS — Y9389 Activity, other specified: Secondary | ICD-10-CM | POA: Insufficient documentation

## 2018-09-17 DIAGNOSIS — Z87891 Personal history of nicotine dependence: Secondary | ICD-10-CM | POA: Diagnosis not present

## 2018-09-17 DIAGNOSIS — S62302A Unspecified fracture of third metacarpal bone, right hand, initial encounter for closed fracture: Secondary | ICD-10-CM | POA: Diagnosis not present

## 2018-09-17 MED ORDER — IBUPROFEN 800 MG PO TABS: 800.0000 mg | ORAL_TABLET | Freq: Three times a day (TID) | ORAL | 0 refills | Status: AC

## 2018-09-17 NOTE — ED Notes (Signed)
Pt states that he could've splinted himself at home and he doesn't think that we splinted it correctly. Double checked with ortho tech and provider and splint is correct. Pt ambulated out of ED without signing.
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
## 2018-09-17 NOTE — ED Notes (Signed)
Ortho tech arrived 
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
## 2018-09-17 NOTE — ED Notes (Signed)
Ortho tech en route 

## 2018-09-17 NOTE — Discharge Instructions (Signed)
Follow-up with Dr. Amedeo Plenty-- call his office for appt. Can take motrin for pain. Return here for any new/acute changes.

## 2018-09-17 NOTE — ED Notes (Signed)
Pt up to nurse's station asking about wait time for splint Explained to pt that we had to call the Ortho Tech to come over from [facility] Pt stated that he could put the splint on himself Pt informed that we would page the Ortho tech again to get an ETA

## 2018-10-14 DIAGNOSIS — S62332A Displaced fracture of neck of third metacarpal bone, right hand, initial encounter for closed fracture: Secondary | ICD-10-CM | POA: Diagnosis not present

## 2018-10-14 DIAGNOSIS — M79644 Pain in right finger(s): Secondary | ICD-10-CM | POA: Diagnosis not present

## 2018-11-05 DIAGNOSIS — M5386 Other specified dorsopathies, lumbar region: Secondary | ICD-10-CM | POA: Diagnosis not present

## 2018-11-05 DIAGNOSIS — M9903 Segmental and somatic dysfunction of lumbar region: Secondary | ICD-10-CM | POA: Diagnosis not present

## 2018-11-05 DIAGNOSIS — M62830 Muscle spasm of back: Secondary | ICD-10-CM | POA: Diagnosis not present

## 2018-11-05 DIAGNOSIS — M9902 Segmental and somatic dysfunction of thoracic region: Secondary | ICD-10-CM | POA: Diagnosis not present

## 2018-11-05 DIAGNOSIS — M9905 Segmental and somatic dysfunction of pelvic region: Secondary | ICD-10-CM | POA: Diagnosis not present

## 2018-11-05 DIAGNOSIS — M5441 Lumbago with sciatica, right side: Secondary | ICD-10-CM | POA: Diagnosis not present

## 2018-11-11 DIAGNOSIS — S91332A Puncture wound without foreign body, left foot, initial encounter: Secondary | ICD-10-CM | POA: Diagnosis not present

## 2018-11-11 DIAGNOSIS — S91302A Unspecified open wound, left foot, initial encounter: Secondary | ICD-10-CM | POA: Diagnosis not present

## 2018-11-18 ENCOUNTER — Encounter: Payer: Self-pay | Admitting: Pulmonary Disease

## 2018-11-18 ENCOUNTER — Ambulatory Visit (INDEPENDENT_AMBULATORY_CARE_PROVIDER_SITE_OTHER): Payer: 59 | Admitting: Pulmonary Disease

## 2018-11-18 ENCOUNTER — Other Ambulatory Visit: Payer: Self-pay

## 2018-11-18 ENCOUNTER — Telehealth: Payer: Self-pay | Admitting: Internal Medicine

## 2018-11-18 DIAGNOSIS — R05 Cough: Secondary | ICD-10-CM | POA: Diagnosis not present

## 2018-11-18 DIAGNOSIS — J40 Bronchitis, not specified as acute or chronic: Secondary | ICD-10-CM | POA: Diagnosis not present

## 2018-11-18 DIAGNOSIS — Z87891 Personal history of nicotine dependence: Secondary | ICD-10-CM | POA: Diagnosis not present

## 2018-11-18 DIAGNOSIS — F1290 Cannabis use, unspecified, uncomplicated: Secondary | ICD-10-CM | POA: Diagnosis not present

## 2018-11-18 DIAGNOSIS — R053 Chronic cough: Secondary | ICD-10-CM

## 2018-11-18 MED ORDER — DOXYCYCLINE HYCLATE 100 MG PO TABS: 100.0000 mg | ORAL_TABLET | Freq: Two times a day (BID) | ORAL | 0 refills | Status: DC
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
## 2018-11-18 NOTE — Telephone Encounter (Signed)
Needs an ov or at least a televisit, not a sputum culture (we almost never do that as a first step)  Esp with this being and he might get sick over the weekend and end up in an er where there might be lots of pts with covid.

## 2018-11-18 NOTE — Progress Notes (Signed)
Chart and office note reviewed in detail  > agree with a/p as outlined    

## 2018-11-18 NOTE — Assessment & Plan Note (Signed)
Plan: Continue not smoke 

## 2018-11-18 NOTE — Telephone Encounter (Signed)
Spoke with pt, aware of MW's recs.  Pt scheduled for televisit today at 2:30 with Aaron Edelman.  Nothing further needed at this time- will close encounter.

## 2018-11-18 NOTE — Telephone Encounter (Signed)
Spoke with patient. He stated that he wanted to see if he get an order for a sputum culture. Stated that MW ordered one for him back in June but the results came back normal. He is now coughing up thick, brownish phlegm is that sticky. Also reports that he has been wheezing more. 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 He denied any SOB, fever or body aches.   MW, please advise. Thanks!

## 2018-11-18 NOTE — Assessment & Plan Note (Signed)
Plan: Doxycycline today Can use Mucinex to help with clearing mucus Close follow-up in office in 4 to 6 weeks Can consider chest x-ray imaging at that point in time if symptoms do not improve   Emphasized need for patient to continue to not smoke cigarettes Emphasized need for the patient to stop smoking marijuana

## 2018-11-18 NOTE — Assessment & Plan Note (Signed)
plan: We will treat with doxycycline today   Patient needs close follow-up in our office

## 2018-11-18 NOTE — Patient Instructions (Signed)
You were seen today by Lauraine Rinne, NP  for:   1. Bronchitis  - doxycycline (VIBRA-TABS) 100 MG tablet; Take 1 tablet (100 mg total) by mouth 2 (two) times daily.  Dispense: 14 tablet; Refill: 0  2. Marijuana smoker  It would be difficult for [place] to further evaluate and manage her cough/mucus production if you continue to smoke marijuana.  We emphasized the need for you to stop smoking today.  3. Chronic cough  We will treat with antibiotics listed above  Scheduled follow-up for patient to follow-up in office Can consider chest x-ray at that office visit  4. 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  No orders of the defined types were placed in this encounter.  Meds ordered this encounter  Medications  . doxycycline (VIBRA-TABS) 100 MG tablet    Sig: Take 1 tablet (100 mg total) by mouth 2 (two) times daily.    Dispense:  14 tablet    Refill:  0    Follow Up:    Return in about 6 weeks (around 12/30/2018), or if symptoms worsen or fail to improve, for Follow up with Dr. Melvyn Novas.   Please do your part to reduce the spread of COVID-19:      Reduce your risk of any infection  and COVID19 by using the similar precautions used for avoiding the common cold or flu:  Marland Kitchen Wash your hands often with soap and warm water for at least 20 seconds.  If soap and water are not readily available, use an alcohol-based hand sanitizer with at least 60% alcohol.  . If coughing or sneezing, cover your mouth and nose by coughing or sneezing into the elbow areas of your shirt or coat, into a tissue or into your sleeve (not your hands). Langley Gauss A MASK when in public  . Avoid shaking hands with others and consider head nods or verbal greetings only. . Avoid touching your eyes, nose, or mouth with unwashed hands.  . Avoid close contact with people who are sick. . Avoid  places or events with large numbers of people in one location, like concerts or sporting events. . If you have some symptoms but not all symptoms, continue to monitor at home and seek medical attention if your symptoms worsen. . If you are having a medical emergency, call 911.   [place] / e-Visit: [email or web address]         [facility]: [facility]: [phone]                   [facility]: [phone]     It is flu season:   >>> Best ways to protect herself from the flu: Receive the yearly flu vaccine, practice good hand hygiene washing with soap and also using hand sanitizer when available, eat a nutritious meals, get adequate rest, hydrate appropriately   Please contact the office if your symptoms worsen or you have concerns that you are not improving.   Thank you for choosing [facility] for your healthcare, and for allowing [place] to partner with you on your healthcare journey. I am thankful to be able to provide care to you today.   Wyn Quaker FNP-C

## 2018-11-18 NOTE — Assessment & Plan Note (Signed)
Smokes marijuana 3 times a week  Plan: Explained to patient today that is difficult for [place] to fully evaluate and manage his cough if he continues to smoke marijuana.  Emphasized the importance the patient to stop smoking.  Patient agrees.

## 2018-12-16 ENCOUNTER — Telehealth: Payer: 59 | Admitting: Family

## 2018-12-16 DIAGNOSIS — M62830 Muscle spasm of back: Secondary | ICD-10-CM | POA: Diagnosis not present

## 2018-12-16 DIAGNOSIS — B9689 Other specified bacterial agents as the cause of diseases classified elsewhere: Secondary | ICD-10-CM | POA: Diagnosis not present

## 2018-12-16 DIAGNOSIS — M9903 Segmental and somatic dysfunction of lumbar region: Secondary | ICD-10-CM | POA: Diagnosis not present

## 2018-12-16 DIAGNOSIS — J028 Acute pharyngitis due to other specified organisms: Secondary | ICD-10-CM

## 2018-12-16 DIAGNOSIS — M5386 Other specified dorsopathies, lumbar region: Secondary | ICD-10-CM | POA: Diagnosis not present

## 2018-12-16 DIAGNOSIS — M5441 Lumbago with sciatica, right side: Secondary | ICD-10-CM | POA: Diagnosis not present

## 2018-12-16 DIAGNOSIS — M9902 Segmental and somatic dysfunction of thoracic region: Secondary | ICD-10-CM | POA: Diagnosis not present

## 2018-12-16 DIAGNOSIS — M9905 Segmental and somatic dysfunction of pelvic region: Secondary | ICD-10-CM | POA: Diagnosis not present

## 2018-12-16 MED ORDER — PENICILLIN V POTASSIUM 500 MG PO TABS: 500.0000 mg | ORAL_TABLET | Freq: Two times a day (BID) | ORAL | 0 refills | Status: AC

## 2018-12-16 NOTE — Progress Notes (Signed)
We are sorry that you are not feeling well.  Here is how we plan to help!  Based on what you have shared with me it is likely that you have strep pharyngitis.  Strep pharyngitis is inflammation and infection in the back of the throat.  This is an infection cause by bacteria and is treated with antibiotics.  I have prescribed Penicillin V 500 mg twice a day for 10 days. For throat pain, we recommend over the counter oral pain relief medications such as acetaminophen or aspirin, or anti-inflammatory medications such as ibuprofen or naproxen sodium. Topical treatments such as oral throat lozenges or sprays may be used as needed. Strep infections are not as easily transmitted as other respiratory infections, however we still recommend that you avoid close contact with loved ones, especially the very young and elderly.  Remember to wash your hands thoroughly throughout the day as this is the number one way to prevent the spread of infection and wipe down door knobs and counters with disinfectant.   Home Care:  Only take medications as instructed by your medical team.  Complete the entire course of an antibiotic.  Do not take these medications with alcohol.  A steam or ultrasonic humidifier can help congestion.  You can place a towel over your head and breathe in the steam from hot water coming from a faucet.  Avoid close contacts especially the very young and the elderly.  Cover your mouth when you cough or sneeze.  Always remember to wash your hands.  Get Help Right Away If:  You develop worsening fever or sinus pain.  You develop a severe head ache or visual changes.  Your symptoms persist after you have completed your treatment plan.  Make sure you  Understand these instructions.  Will watch your condition.  Will get help right away if you are not doing well or get worse.  Your e-visit answers were reviewed by a board certified advanced clinical practitioner to complete your  personal care plan.  Depending on the condition, your plan could have included both over the counter or prescription medications.  If there is a problem please reply  once you have received a response from your provider.  Your safety is important to [place].  If you have drug allergies check your prescription carefully.    You can use MyChart to ask questions about today's visit, request a non-urgent call back, or ask for a work or school excuse for 24 hours related to this e-Visit. If it has been greater than 24 hours you will need to follow up with your provider, or enter a new e-Visit to address those concerns.  You will get an e-mail in the next two days asking about your experience.  I hope that your e-visit has been valuable and will speed your recovery. Thank you for using e-visits.  Greater than 5 minutes, yet less than 10 minutes of time have been spent researching, coordinating, and implementing care for this patient today.  Thank you for the details you included in the comment boxes. Those details are very helpful in determining the best course of treatment for you and help [place] to provide the best care.

## 2018-12-26 DIAGNOSIS — M9905 Segmental and somatic dysfunction of pelvic region: Secondary | ICD-10-CM | POA: Diagnosis not present

## 2018-12-26 DIAGNOSIS — M5441 Lumbago with sciatica, right side: Secondary | ICD-10-CM | POA: Diagnosis not present

## 2018-12-26 DIAGNOSIS — M62830 Muscle spasm of back: Secondary | ICD-10-CM | POA: Diagnosis not present

## 2018-12-26 DIAGNOSIS — M9902 Segmental and somatic dysfunction of thoracic region: Secondary | ICD-10-CM | POA: Diagnosis not present

## 2018-12-26 DIAGNOSIS — M9903 Segmental and somatic dysfunction of lumbar region: Secondary | ICD-10-CM | POA: Diagnosis not present

## 2018-12-26 DIAGNOSIS — M5386 Other specified dorsopathies, lumbar region: Secondary | ICD-10-CM | POA: Diagnosis not present

## 2018-12-30 ENCOUNTER — Encounter: Payer: Self-pay | Admitting: Internal Medicine

## 2018-12-30 ENCOUNTER — Ambulatory Visit (INDEPENDENT_AMBULATORY_CARE_PROVIDER_SITE_OTHER): Payer: 59

## 2018-12-30 ENCOUNTER — Ambulatory Visit: Payer: 59 | Admitting: Internal Medicine

## 2018-12-30 ENCOUNTER — Ambulatory Visit: Payer: 59

## 2018-12-30 ENCOUNTER — Other Ambulatory Visit: Payer: Self-pay

## 2018-12-30 ENCOUNTER — Telehealth: Payer: Self-pay | Admitting: Internal Medicine

## 2018-12-30 VITALS — BP 120/68 | HR 70 | Temp 98.0°F | Ht 70.0 in | Wt 187.0 lb

## 2018-12-30 DIAGNOSIS — R053 Chronic cough: Secondary | ICD-10-CM

## 2018-12-30 DIAGNOSIS — R05 Cough: Secondary | ICD-10-CM

## 2018-12-30 DIAGNOSIS — F1290 Cannabis use, unspecified, uncomplicated: Secondary | ICD-10-CM

## 2018-12-30 MED ORDER — DOXYCYCLINE HYCLATE 100 MG PO TABS: 100.0000 mg | ORAL_TABLET | Freq: Two times a day (BID) | ORAL | 0 refills | Status: AC

## 2018-12-30 MED ORDER — FAMOTIDINE 20 MG PO TABS: ORAL_TABLET | ORAL | 11 refills | Status: AC

## 2018-12-30 NOTE — Telephone Encounter (Signed)
Called and spoke with pt who stated twice he spat up a small amount of mucus that had streaks of blood in it. Pt said he does have a cough and when he coughed is when he saw the streaks of blood in the mucus.  Pt wants to know if this could be due to sinuses or due to his lungs. Pt said that he has stopped smoking marijuana but then after saying that, he changed it to that he is now only smoking it once a week due to anxiety and depression. Pt said this is down from when he used to do it three times a week.  Pt has some discomfort in his chest which he says he is associating it to the coughing. Pt said his cough has been continuously going on 1 year which he says is better now than it was.  Pt denies any complaints of fever.  Since he did cough up the mucus with the streaks of blood in it, this was a concern to him and he wants recommendations.  Dr. Melvyn Novas, please advise. Thanks.

## 2018-12-30 NOTE — Telephone Encounter (Signed)
Ov with cxr  In meantime can use mucinex dm 1200 mg bid and no smoking at all (anything)

## 2018-12-30 NOTE — Patient Instructions (Addendum)
For cough mucinex dm 1200 mg every 12 hours as needed   Doxycycline 100 mg one twice daily x 10 days   Add pepcid 20 mg at bedtime   GERD (REFLUX)  is an extremely common cause of respiratory symptoms just like yours , many times with no obvious heartburn at all.    It can be treated with medication, but also with lifestyle changes including elevation of the head of your bed (ideally with 6 -8inch blocks under the headboard of your bed),  Smoking cessation, avoidance of late meals, excessive alcohol, and avoid fatty foods, chocolate, peppermint, colas, red wine, and acidic juices such as orange juice.  NO MINT OR MENTHOL PRODUCTS SO NO COUGH DROPS  USE SUGARLESS CANDY INSTEAD (Jolley ranchers or Stover's or Life Savers) or even ice chips will also do - the key is to swallow to prevent all throat clearing. NO OIL BASED VITAMINS - use powdered substitutes.  Avoid fish oil when coughing.   If not improving the next step is referral to ENT please call for referral if not better after holidays   Please breathe clean air and use saline spray for your nose as it is the safest.

## 2018-12-30 NOTE — Progress Notes (Signed)
Willie Lee, male    DOB: [birth year],    MRN: [id number]   Brief patient profile:  23 yowm very healthy kid, good runner and swimmer stopped age 23 when started smoking cigs/MJ and Sept 2019 tried to run 2 miles then after running felt tingly /light headed > ER 10/11/17 clinical dx dedhydration and tingling went away but then chonic cough with grey mucus rx zpak /blood work "ok" and grey mucus only resolved transiently and despite the decision to stop cigarettes 10/12/17 continued with dark mucus until stopped MJ last week in Feb 2019 and mucus less dark since, occ slt blood streak so referred to pulmonary clinic 03/14/2018 by Dr   Farris Has.      History of Present Illness  03/14/2018  Pulmonary/ 1st office eval/[facility]  Chief Complaint  Patient presents with  . Pulmonary Consult    Referred by Dr. Farris Has. Pt c/o cough with dark grey sputum x 4 months. He c/o CP off and on since Jan 2020- mainly occurs in the am when he coughs up sputum.   Dyspnea:  Jumpy jacks q am / no real aerobics  Cough: mucus is still grey esp in am x one tsb but no longer bloody but assoc with bilateral ant cp with coughing fits  Sleep: able to sleep on side /  flat bed with one pillow rec For cough > mucinex dm 1200mg  every 12 hours  As needed GERD diet    07/04/2018  f/u ov/Amere Iott re: cough since Sept 2019 / mucinex d x  one dose only  Chief Complaint  Patient presents with  . Follow-up    Still has some chest discomfort occ on both sides.   Dyspnea:  Not limited / started jogging  Cough: gone though still with sensation of too much throat mucus  Sleeping: propped up sleeps ok  SABA use: none CP  Migrates from side to side only in sitting position, never supine rec ibs diet      Televisit 11/18/2018 13/06/2018 NP recs Doxycycline today Can use Mucinex to help with clearing mucus Close follow-up in office in 4 to 6 weeks Can consider chest x-ray imaging at that point in time if symptoms do not  improve   12/30/2018  Acute extended ov/Raelea Gosse re: acute flare of chronic cough  Chief Complaint  Patient presents with  . Acute Visit    Chronic Cough  Dyspnea:  Still jogging  Cough: daily x fall 2019 immediate sense on waking up feels urge to hock x first hour or so >>> Usually mucoid with some darker areas and new steak of blood on day of ov Sleeping: fine bed is flat with two pillows  SABA use: none  02: none    No obvious day to day or daytime variability or assoc excess/ purulent sputum or mucus plugs or hemoptysis or cp or chest tightness, subjective wheeze or overt sinus or hb symptoms.   Sleeping without nocturnal  or early am exacerbation  of respiratory  c/o's or need for noct saba. Also denies any obvious fluctuation of symptoms with weather or environmental changes or other aggravating or alleviating factors except as outlined above   No unusual exposure hx or h/o childhood pna/ asthma or knowledge of premature birth.  Current Allergies, Complete Past Medical History, Past Surgical History, Family History, and Social History were reviewed in 2020 record.  ROS  The following are not active complaints unless bolded Hoarseness, sore throat, dysphagia,  dental problems, itching, sneezing,  nasal congestion or discharge of excess mucus or purulent secretions, ear ache,   fever, chills, sweats, unintended wt loss or wt gain, classically pleuritic or exertional cp,  orthopnea pnd or arm/hand swelling  or leg swelling, presyncope, palpitations, abdominal pain, anorexia, nausea, vomiting, diarrhea  or change in bowel habits or change in bladder habits, change in stools or change in urine, dysuria, hematuria,  rash, arthralgias, visual complaints, headache, numbness, weakness or ataxia or problems with walking or coordination,  change in mood or  memory.        Current Meds  Medication Sig  . ibuprofen (ADVIL) 800 MG tablet Take 1 tablet (800 mg total) by  mouth 3 (three) times daily.  . penicillin v potassium (VEETID) 500 MG tablet Take 1 tablet (500 mg total) by mouth 2 (two) times daily.             Objective:      12/30/2018   07/04/18 190 lb (86.2 kg)  03/14/18 188 lb 9.6 oz (85.5 kg)  01/05/18 185 lb (83.9 kg)    Somber amb wm nad  Vital signs reviewed - Note on arrival 02 sats  98% on RA   HEENT : pt wearing mask not removed for exam due to covid -19 concerns.    NECK :  without JVD/Nodes/TM/ nl carotid upstrokes bilaterally   LUNGS: no acc muscle use,  Nl contour chest which is clear to A and P bilaterally without cough on insp or exp maneuvers   CV:  RRR  no s3 or murmur or increase in P2, and no edema   ABD:  soft and nontender with nl inspiratory excursion in the supine position. No bruits or organomegaly appreciated, bowel sounds nl  MS:  Nl gait/ ext warm without deformities, calf tenderness, cyanosis or clubbing No obvious joint restrictions   SKIN: warm and dry without lesions    NEURO:  alert, approp, nl sensorium with  no motor or cerebellar deficits apparent.               CXR PA and Lateral:   12/30/2018 :    I personally reviewed images and agree with radiology impression as follows:    No acute cardiopulmonary disease.    Assessment

## 2018-12-30 NOTE — Telephone Encounter (Signed)
Called and spoke with pt letting him know we needed to get him scheduled for an OV with cxr prior. Pt verbalized understanding. appt scheduled for pt today at 4:15 with MW and order placed for the cxr. Nothing further needed.

## 2018-12-31 ENCOUNTER — Encounter: Payer: Self-pay | Admitting: Internal Medicine

## 2018-12-31 NOTE — Assessment & Plan Note (Signed)
Counseled re importance of all smoking cessation but did not meet time criteria for separate billing              

## 2018-12-31 NOTE — Assessment & Plan Note (Addendum)
Quit smoking 10/12/17 then noted throat clearing   - Spirometry 03/14/2018  FEV1 4.6 (99%)  Ratio 0.72 no curvature in effort indep portion off all rx     Unusual hx of cough p stopped smoking but still does use MJ sev times a week. May have mild bronchitis at present but more likely this is Upper airway cough syndrome (previously labeled PNDS),  is so named because it's frequently impossible to sort out how much is  CR/sinusitis with freq throat clearing (which can be related to primary GERD)   vs  causing  secondary (" extra esophageal")  GERD from wide swings in gastric pressure that occur with throat clearing, often  promoting self use of mint and menthol lozenges that reduce the lower esophageal sphincter tone and exacerbate the problem further in a cyclical fashion.   These are the same pts (now being labeled as having "irritable larynx syndrome" by some cough centers) who not infrequently have a history of having failed to tolerate ace inhibitors,  dry powder inhalers or biphosphonates or report having atypical/extraesophageal reflux symptoms that don't respond to standard doses of PPI  and are easily confused as having aecopd or asthma flares by even experienced allergists/ pulmonologists (myself included).   rec rx doxy x 10 days to cover bronchitis and add pepcid  At bedtime plus gerd diet to see what effect this has on am urge to "hock x one hour" and encourage non-mint /menthol candies or water and swallowing secretions to reduce tendency to cyclical coughing/ habitual cough.  If not better next step in ENT eval p holidays.   I had an extended discussion with the patient reviewing all relevant studies completed to date and  lasting 15 to 20 minutes of a 25 minute acute office visit    Each maintenance medication was reviewed in detail including most importantly the difference between maintenance and prns and under what circumstances the prns are to be triggered using an action plan format that  is not reflected in the computer generated alphabetically organized AVS.     Please see AVS for specific instructions unique to this visit that I personally wrote and verbalized to the the pt in detail and then reviewed with pt  by my nurse highlighting any  changes in therapy recommended at today's visit to their plan of care.

## 2019-01-09 ENCOUNTER — Other Ambulatory Visit: Payer: Self-pay | Admitting: General Surgery

## 2019-01-09 DIAGNOSIS — R05 Cough: Secondary | ICD-10-CM

## 2019-01-09 DIAGNOSIS — R053 Chronic cough: Secondary | ICD-10-CM

## 2019-01-09 DIAGNOSIS — F1290 Cannabis use, unspecified, uncomplicated: Secondary | ICD-10-CM

## 2019-01-09 NOTE — Telephone Encounter (Signed)
Ok to refer to [facility]

## 2019-02-22 ENCOUNTER — Ambulatory Visit: Payer: 59 | Attending: Internal Medicine

## 2019-02-22 DIAGNOSIS — Z20822 Contact with and (suspected) exposure to covid-19: Secondary | ICD-10-CM | POA: Diagnosis not present

## 2019-02-22 NOTE — Telephone Encounter (Signed)
[facility]- hey, can you check on his ENT referral please? He is emailing about it, thanks!

## 2019-02-22 NOTE — Telephone Encounter (Signed)
I called [facility] and spoke to [place].  She states there is a note on the referral - do not schedule, not financially cleared.  She asked me what insurance pt has and I told her we have that it is [facility].  She asked if we can have the pt to call them.  Their phone # is [phone].  Will send back to triage to have them to send MyChart message to the pt & have him to call their office for appt.

## 2019-02-22 NOTE — Telephone Encounter (Signed)
I am going to call [facility] to check on referral.

## 2019-02-23 DIAGNOSIS — J358 Other chronic diseases of tonsils and adenoids: Secondary | ICD-10-CM | POA: Diagnosis not present

## 2019-02-23 DIAGNOSIS — F411 Generalized anxiety disorder: Secondary | ICD-10-CM | POA: Diagnosis not present

## 2019-02-23 DIAGNOSIS — F3341 Major depressive disorder, recurrent, in partial remission: Secondary | ICD-10-CM | POA: Diagnosis not present

## 2019-02-23 LAB — NOVEL CORONAVIRUS, NAA: SARS-CoV-2, NAA: NOT DETECTED

## 2019-03-23 DIAGNOSIS — H9202 Otalgia, left ear: Secondary | ICD-10-CM | POA: Diagnosis not present

## 2019-03-23 DIAGNOSIS — M2669 Other specified disorders of temporomandibular joint: Secondary | ICD-10-CM | POA: Insufficient documentation

## 2019-03-23 DIAGNOSIS — K1379 Other lesions of oral mucosa: Secondary | ICD-10-CM | POA: Insufficient documentation

## 2019-12-11 ENCOUNTER — Other Ambulatory Visit: Payer: 59

## 2020-01-23 DIAGNOSIS — U071 COVID-19: Secondary | ICD-10-CM | POA: Diagnosis not present

## 2020-05-06 DIAGNOSIS — R410 Disorientation, unspecified: Secondary | ICD-10-CM | POA: Diagnosis not present

## 2020-05-06 DIAGNOSIS — M25511 Pain in right shoulder: Secondary | ICD-10-CM | POA: Diagnosis not present

## 2020-05-06 DIAGNOSIS — S161XXA Strain of muscle, fascia and tendon at neck level, initial encounter: Secondary | ICD-10-CM | POA: Diagnosis not present

## 2020-05-27 ENCOUNTER — Ambulatory Visit (HOSPITAL_BASED_OUTPATIENT_CLINIC_OR_DEPARTMENT_OTHER): Payer: 59 | Admitting: Nurse Practitioner

## 2020-05-27 NOTE — Progress Notes (Deleted)
  Shawna Clamp, DNP, AGNP-c Primary Care Services ______________________________________________________________________________________________________________________________________________  HPI Lan Mcneill is a 25 y.o. year old male presenting to [facility] today to establish care.  Previous PCP: Last CPE was: Other providers seen:  Concerns today:  Narrative: Cutler Sunday is Significant Other and currently lives with ***. Endorses *** children, ages ***. Reports {IS SAFE:19720} in their current relationships and home environment.  Currently {DESC; EMPLOYMENT STATUS:32210} at ***. Diet consists of ***. Physical activity of *** reported *** times per week for *** minutes per day. {Actions; denies-reports:120008} nicotine use, {Actions; denies-reports:120008} recreational drug use, and {Actions; denies-reports:120008} alcohol use (***). {Actions; denies-reports:120008} {ACTION; IS/IS [id number]} currently sexually active with {1, 2, 3+:18709} sexual partners.  {Actions; denies-reports:120008} history of STI and {Actions; denies-reports:120008} concerns for STI today. {ACTION; IS/IS [id number]} planning pregnancy at this time. Contraception choices are ***. {Actions; denies-reports:120008} genital concerns with symptoms of ***. {Regular/irregular menstrual period abdominal pain hpi md:30583} {Actions; denies-reports:120008} recent changes to bowel habits, {Actions; denies-reports:120008} recent changes to bladder habits, {Actions; denies-reports:120008} recent changes to skin.  {Actions; denies-reports:120008} recent mood related changes. PHQ and GAD listed below. PHQ9 Today: No flowsheet data found. GAD7 Today: No flowsheet data found.  Health Maintenance: Labs:  Mammogram: Colonoscopy: Cologuard: DEXA: Pap: HPV:  PPSV23: PCV13: Shingrix: Flu: COVID: HPV: Tdap:  PMH No past medical history on  file.  ROS All review of systems negative except what is listed in the HPI  PHYSICAL EXAM {PHYSICAL EXAM WITH PROVIDER CHOICES:22563}  ASSESSMENT AND PLAN Problem List Items Addressed This Visit   None     Education provided today during visit and on AVS for patient to review at home. Diet and Exercise recommendations as well as routine health maintenance information provided.  Current diagnoses discussed and recommendations provided.   Outpatient Encounter Medications as of 05/27/2020  Medication Sig  . doxycycline (VIBRA-TABS) 100 MG tablet Take 1 tablet (100 mg total) by mouth 2 (two) times daily.  . famotidine (PEPCID) 20 MG tablet One after supper  . ibuprofen (ADVIL) 800 MG tablet Take 1 tablet (800 mg total) by mouth 3 (three) times daily.  . penicillin v potassium (VEETID) 500 MG tablet Take 1 tablet (500 mg total) by mouth 2 (two) times daily.   No facility-administered encounter medications on file as of 05/27/2020.    No follow-ups on file.  Tollie Eth, DNP, AGNP-c

## 2020-05-27 NOTE — Patient Instructions (Incomplete)
Recommendations from today's visit: .   Information on diet, exercise, and health maintenance recommendations are listed below. This is information to help you be sure you are on track for optimal health and monitoring.   Please look over this and let us know if you have any questions or if you have completed any of the health maintenance outside of [facility] so that we can be sure your records are up to date.  ___________________________________________________________  Thank you for choosing [facility] for your Primary Care needs. I am excited for the opportunity to partner with you to meet your health care goals. It was a pleasure meeting you today!  I am an Adult-Geriatric Nurse Practitioner with a background in caring for patients for more than 20 years. I received my Bachelor of Science in Nursing and my Doctor of Nursing Practice degrees at [employer]. I received additional fellowship training in primary care and sports medicine after receiving my doctorate degree. I provide primary care and sports medicine services to patients age 13 and older within this office. I am also a provider with the [facility] and the director of the APP Fellowship with [employer].  I am a [place] native, but have called the [place] area home for nearly 20 years and am proud to be a member of this community.   I am passionate about providing the best service to you through preventive medicine and supportive care. I consider you a part of the medical team and value your input. I work diligently to ensure that you are heard and your needs are met in a safe and effective manner. I want you to feel comfortable with me as your provider and want you to know that your health concerns are important to me.   For your information, our office hours are Monday- Friday 8:00 AM - 5:00 PM At this time I am not in the office on Wednesdays.  If  you have questions or concerns, please call our office at [phone] or send us a MyChart message and we will respond as quickly as possible.   For all urgent or time sensitive needs we ask that you please call the office to avoid delays. MyChart is not constantly monitored and replies may take up to 72 business hours.  MyChart Policy: . MyChart allows for you to see your visit notes, after visit summary, provider recommendations, lab and tests results, make an appointment, request refills, and contact your provider or the office for non-urgent questions or concerns.  . Providers are seeing patients during normal business hours and do not have built in time to review MyChart messages. We ask that you allow a minimum of 72 business hours for MyChart message responses.  . Complex MyChart concerns may require a visit. Your provider may request you schedule a virtual or in person visit to ensure we are providing the best care possible. . MyChart messages sent after 4:00 PM on Friday will not be received by the provider until Monday morning.    Lab and Test Results: . You will receive your lab and test results on MyChart as soon as they are completed and results have been sent by the lab or testing facility. Due to this service, you will receive your results BEFORE your provider.  . Please allow a minimum of 72 business hours for your provider to receive and review lab and test results and contact you about.   .   Most lab and test result comments from the provider will be sent through MyChart. Your provider may recommend changes to the plan of care, follow-up visits, repeat testing, ask questions, or request an office visit to discuss these results. You may reply directly to this message or call the office at [phone] to provide information for the provider or set up an appointment. . In some instances, you will be called with test results and recommendations. Please let us know if this is preferred  and we will make note of this in your chart to provide this for you.    . If you have not heard a response to your lab or test results in 72 business hours, please call the office to let us know.   After Hours: . For all non-emergency after hours needs, please call the office at [phone] and select the option to reach the on-call provider service. On-call services are shared between multiple [facility] offices and therefore it will not be possible to speak directly with your provider. On-call providers may provide medical advice and recommendations, but are unable to provide refills for maintenance medications.  . For all emergency or urgent medical needs after normal business hours, we recommend that you seek care at the closest Urgent Care or Emergency Department to ensure appropriate treatment in a timely manner.  . [facility] has a 24 hour emergency room located on the ground floor for your convenience.    Please do not hesitate to reach out to us with concerns.   Thank you, again, for choosing me as your health care partner. I appreciate your trust and look forward to learning more about you.   SaraBeth Melisssa Donner, DNP, AGNP-c ___________________________________________________________  Health Maintenance Recommendations Screening Testing  Mammogram  Every 1 -2 years based on history and risk factors  Starting at age 40  Pap Smear  Ages 21-39 every 3 years  Ages 30-65 every 5 years with HPV testing  More frequent testing may be required based on results and history  Colon Cancer Screening  Every 1-10 years based on test performed, risk factors, and history  Starting at age 45  Bone Density Screening  Every 2-10 years based on history  Starting at age 65 for women  Recommendations for men differ based on medication usage, history, and risk factors  AAA Screening  One time ultrasound  Men 65-75 years old who have every smoked  Lung  Cancer Screening  Low Dose Lung CT every 12 months  Age 55-80 years with a 30 pack-year smoking history who still smoke or who have quit within the last 15 years  Screening Labs  Routine  Labs: Complete Blood Count (CBC), Complete Metabolic Panel (CMP), Cholesterol (Lipid Panel)  Every 6-12 months based on history and medications  May be recommended more frequently based on current conditions or previous results  Hemoglobin A1c Lab  Every 3-12 months based on history and previous results  Starting at age 45 or earlier with diagnosis of diabetes, high cholesterol, BMI >26, and/or risk factors  Frequent monitoring for patients with diabetes to ensure blood sugar control  Thyroid Panel (TSH w/ T3 & T4)  Every 6 months based on history, symptoms, and risk factors  May be repeated more often if on medication  HIV  One time testing for all patients 13 and older  May be repeated more frequently for patients with increased risk factors or exposure  Hepatitis C  One time testing for all   patients 18 and older  May be repeated more frequently for patients with increased risk factors or exposure  Gonorrhea, Chlamydia  Every 12 months for all sexually active persons 13-24 years  Additional monitoring may be recommended for those who are considered high risk or who have symptoms  PSA  Men 40-54 years old with risk factors  Additional screening may be recommended from age 55-69 based on risk factors, symptoms, and history  Vaccine Recommendations  Tetanus Booster  All adults every 10 years  Flu Vaccine  All patients 6 months and older every year  COVID Vaccine  All patients 12 years and older  Initial dosing with booster  May recommend additional booster based on age and health history  HPV Vaccine  2 doses all patients age 9-26  Dosing may be considered for patients over 26  Shingles Vaccine (Shingrix)  2 doses all adults 55 years and older  Pneumonia  (Pneumovax 23)  All adults 65 years and older  May recommend earlier dosing based on health history  Pneumonia (Prevnar 13)  All adults 65 years and older  Dosed 1 year after Pneumovax 23  Additional Screening, Testing, and Vaccinations may be recommended on an individualized basis based on family history, health history, risk factors, and/or exposure.  __________________________________________________________  Diet Recommendations for All Patients  I recommend that all patients maintain a diet low in saturated fats, carbohydrates, and cholesterol. While this can be challenging at first, it is not impossible and small changes can make big differences.  Things to try: . Decreasing the amount of soda, sweet tea, and/or juice to one or less per day and replace with water o While water is always the first choice, if you do not like water you may consider - adding a water additive without sugar to improve the taste - other sugar free drinks . Replace potatoes with a brightly colored vegetable at dinner . Use healthy oils, such as canola oil or olive oil, instead of butter or hard margarine . Limit your bread intake to two pieces or less a day . Replace regular pasta with low carb pasta options . Bake, broil, or grill foods instead of frying . Monitor portion sizes  . Eat smaller, more frequent meals throughout the day instead of large meals  An important thing to remember is, if you love foods that are not great for your health, you don't have to give them up completely. Instead, allow these foods to be a reward when you have done well. Allowing yourself to still have special treats every once in a while is a nice way to tell yourself thank you for working hard to keep yourself healthy.   Also remember that every day is a new day. If you have a bad day and "fall off the wagon", you can still climb right back up and keep moving along on your journey!  We have resources available to help  you!  Some websites that may be helpful include: . www.MyPlate.gov  . Www.VeryWellFit.com _____________________________________________________________  Activity Recommendations for All Patients  I recommend that all adults get at least 20 minutes of moderate physical activity that elevates your heart rate at least 5 days out of the week.  Some examples include: . Walking or jogging at a pace that allows you to carry on a conversation . Cycling (stationary bike or outdoors) . Water aerobics . Yoga . Weight lifting . Dancing If physical limitations prevent you from putting stress on your joints, exercise   in a pool or seated in a chair are excellent options.  Do determine your MAXIMUM heart rate for activity: YOUR AGE - 220 = MAX HeartRate   Remember! . Do not push yourself too hard.  . Start slowly and build up your pace, speed, weight, time in exercise, etc.  . Allow your body to rest between exercise and get good sleep. . You will need more water than normal when you are exerting yourself. Do not wait until you are thirsty to drink. Drink with a purpose of getting in at least 8, 8 ounce glasses of water a day plus more depending on how much you exercise and sweat.    If you begin to develop dizziness, chest pain, abdominal pain, jaw pain, shortness of breath, headache, vision changes, lightheadedness, or other concerning symptoms, stop the activity and allow your body to rest. If your symptoms are severe, seek emergency evaluation immediately. If your symptoms are concerning, but not severe, please let us know so that we can recommend further evaluation.   ________________________________________________________________   

## 2020-06-04 ENCOUNTER — Encounter (HOSPITAL_BASED_OUTPATIENT_CLINIC_OR_DEPARTMENT_OTHER): Payer: Self-pay | Admitting: Nurse Practitioner

## 2020-12-09 IMAGING — DX DG CHEST 2V
2 series · 2 of 2 positions shown · non-contrast
Comparison: Chest x-ray 03/14/2018.

CLINICAL DATA: Cough.

EXAM:
CHEST - 2 VIEW

[chest pa]
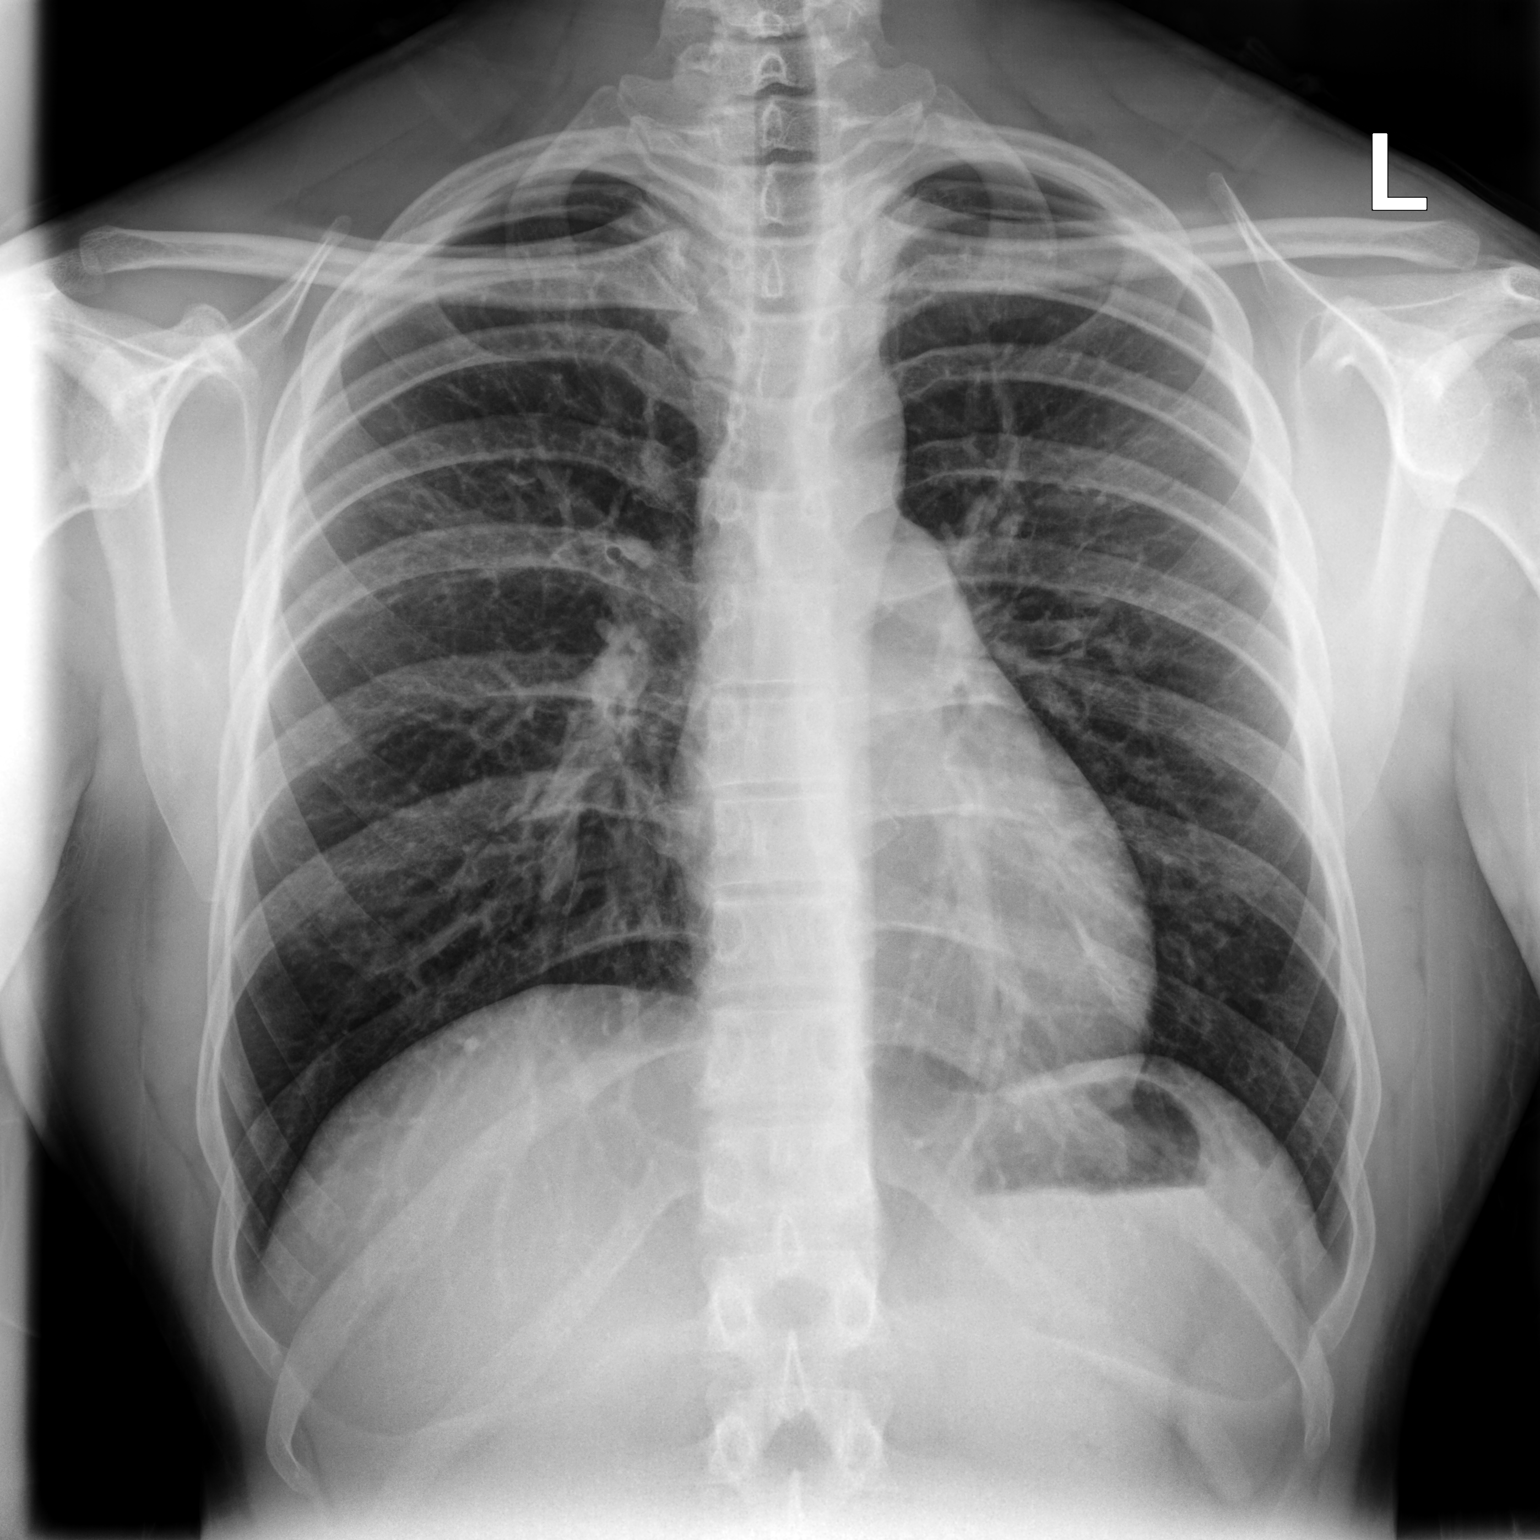

[chest lat]
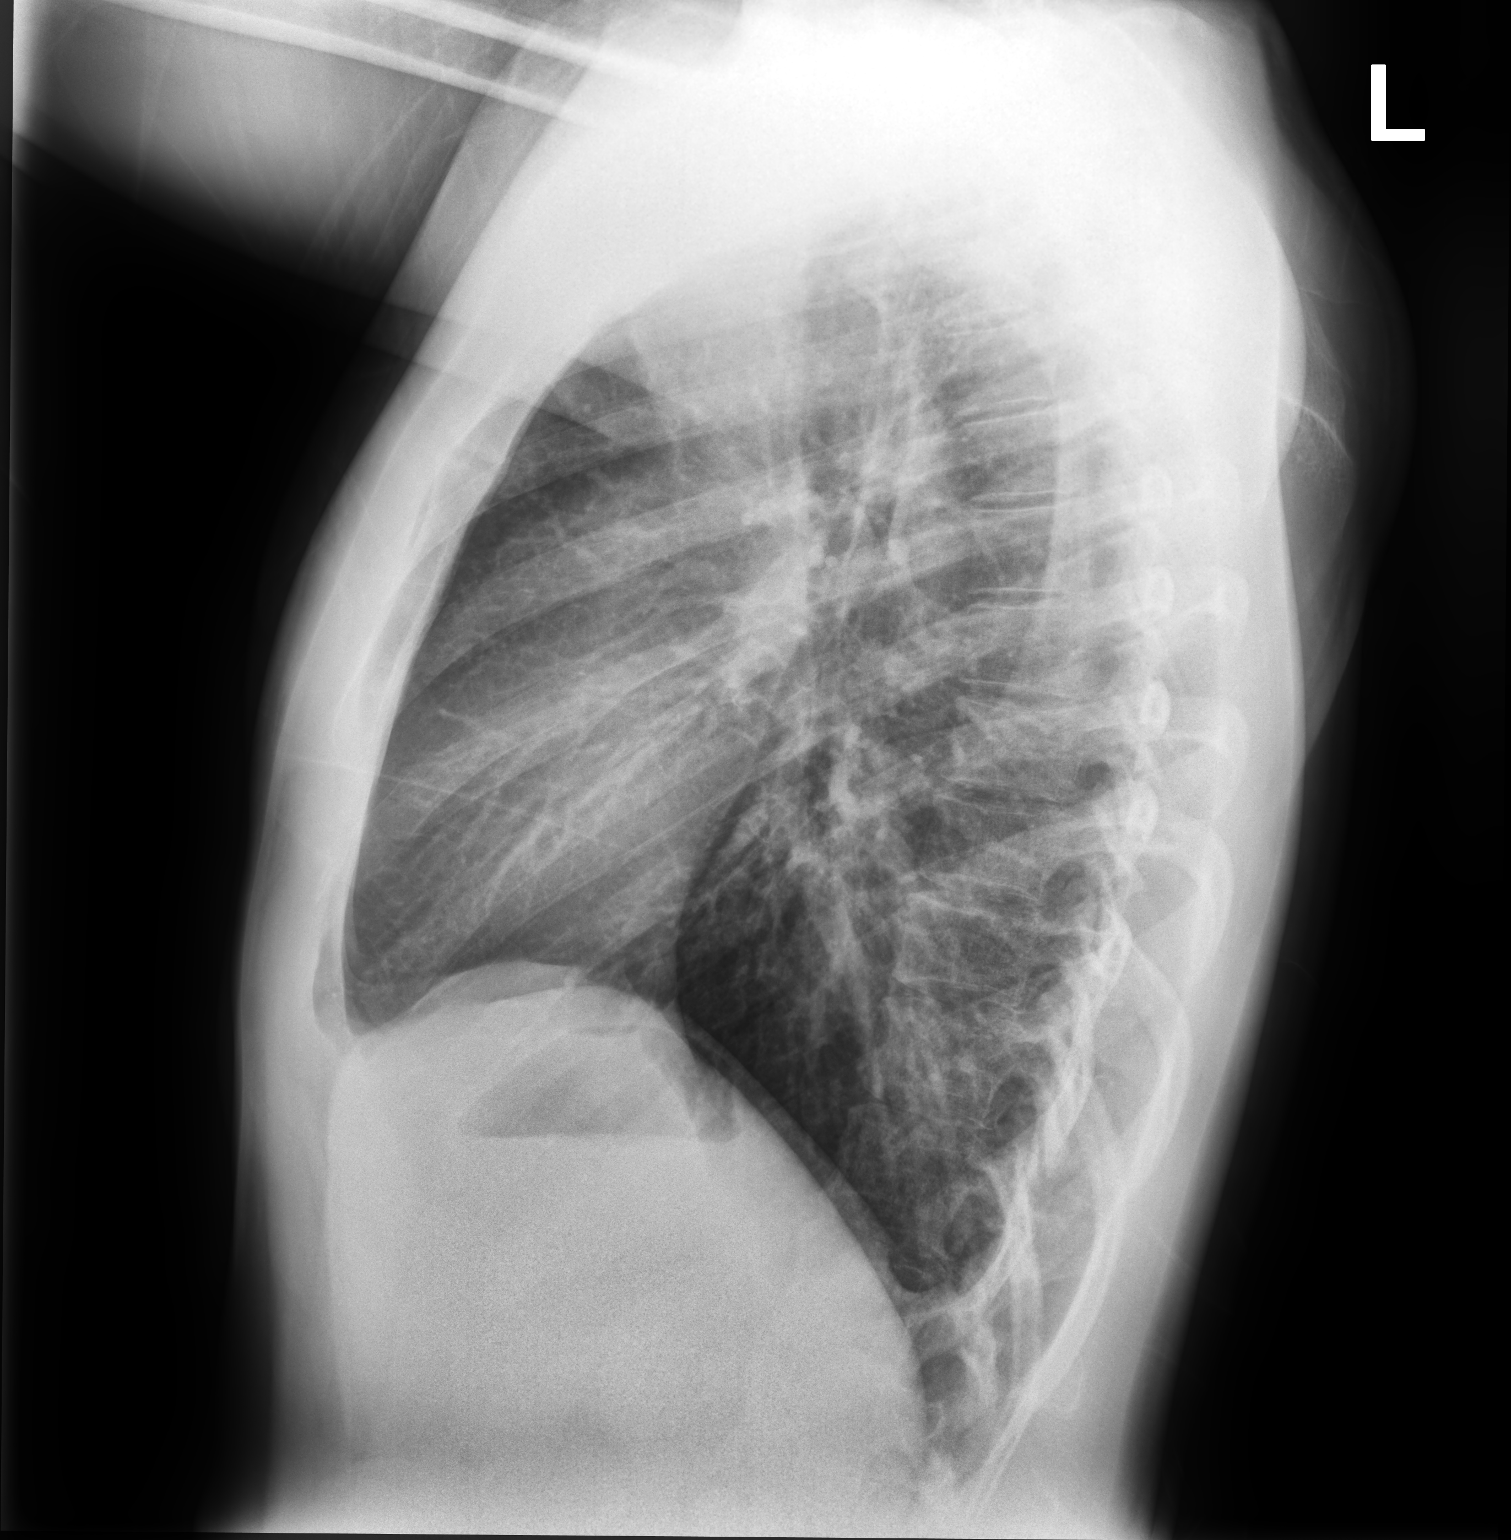

[2 of 2 positions shown; findings below may reference images not displayed]

FINDINGS: Mediastinum and hilar structures normal. Lungs are clear. No pleural
effusion or pneumothorax. Heart size normal. Degenerative change
thoracic spine.
IMPRESSION: No acute cardiopulmonary disease.

## 2021-04-16 ENCOUNTER — Telehealth: Payer: 59 | Admitting: Nurse Practitioner

## 2021-04-16 DIAGNOSIS — L249 Irritant contact dermatitis, unspecified cause: Secondary | ICD-10-CM

## 2021-04-16 MED ORDER — PREDNISONE 10 MG (21) PO TBPK: ORAL_TABLET | ORAL | 0 refills | Status: AC

## 2021-04-16 NOTE — Progress Notes (Signed)
E Visit for Rash  We are sorry that you are not feeling well. Here is how we plan to help!  Based on what you shared with me it looks like you have contact dermatitis.  Contact dermatitis is a skin rash caused by something that touches the skin and causes irritation or inflammation.  Your skin may be red, swollen, dry, cracked, and itch.  The rash should go away in a few days but can last a few weeks.  If you get a rash, it's important to figure out what caused it so the irritant can be avoided in the future. and I am prescribing a two week course of steroids (37 tablets of 10 mg prednisone).  Days 1-4 take 4 tablets (40 mg) daily  Days 5-8 take 3 tablets (30 mg) daily, Days 9-11 take 2 tablets (20 mg) daily, Days 12-14 take 1 tablet (10 mg) daily.      HOME CARE:  Take cool showers and avoid direct sunlight. Apply cool compress or wet dressings. Take a bath in an oatmeal bath.  Sprinkle content of one Aveeno packet under running faucet with comfortably warm water.  Bathe for 15-20 minutes, 1-2 times daily.  Pat dry with a towel. Do not rub the rash. Use hydrocortisone cream. Take an antihistamine like Benadryl for widespread rashes that itch.  The adult dose of Benadryl is 25-50 mg by mouth 4 times daily. Caution:  This type of medication may cause sleepiness.  Do not drink alcohol, drive, or operate dangerous machinery while taking antihistamines.  Do not take these medications if you have prostate enlargement.  Read package instructions thoroughly on all medications that you take.  GET HELP RIGHT AWAY IF:  Symptoms don't go away after treatment. Severe itching that persists. If you rash spreads or swells. If you rash begins to smell. If it blisters and opens or develops a yellow-brown crust. You develop a fever. You have a sore throat. You become short of breath.  MAKE SURE YOU:  Understand these instructions. Will watch your condition. Will get help right away if you are not  doing well or get worse.  Thank you for choosing an e-visit.  Your e-visit answers were reviewed by a board certified advanced clinical practitioner to complete your personal care plan. Depending upon the condition, your plan could have included both over the counter or prescription medications.  Please review your pharmacy choice. Make sure the pharmacy is open so you can pick up prescription now. If there is a problem, you may contact your provider through MyChart messaging and have the prescription routed to another pharmacy.  Your safety is important to us. If you have drug allergies check your prescription carefully.   For the next 24 hours you can use MyChart to ask questions about today's visit, request a non-urgent call back, or ask for a work or school excuse. You will get an email in the next two days asking about your experience. I hope that your e-visit has been valuable and will speed your recovery.  5-10 minutes spent reviewing and documenting in chart.  

## 2021-04-16 NOTE — Progress Notes (Signed)
E Visit for Rash  We are sorry that you are not feeling well. Here is how we plan to help!  Based on what you shared with me it looks like you have contact dermatitis.  Contact dermatitis is a skin rash caused by something that touches the skin and causes irritation or inflammation.  Your skin may be red, swollen, dry, cracked, and itch.  The rash should go away in a few days but can last a few weeks.  If you get a rash, it's important to figure out what caused it so the irritant can be avoided in the future. and I am prescribing a two week course of steroids (37 tablets of 10 mg prednisone).  Days 1-4 take 4 tablets (40 mg) daily  Days 5-8 take 3 tablets (30 mg) daily, Days 9-11 take 2 tablets (20 mg) daily, Days 12-14 take 1 tablet (10 mg) daily.       HOME CARE:  Take cool showers and avoid direct sunlight. Apply cool compress or wet dressings. Take a bath in an oatmeal bath.  Sprinkle content of one Aveeno packet under running faucet with comfortably warm water.  Bathe for 15-20 minutes, 1-2 times daily.  Pat dry with a towel. Do not rub the rash. Use hydrocortisone cream. Take an antihistamine like Benadryl for widespread rashes that itch.  The adult dose of Benadryl is 25-50 mg by mouth 4 times daily. Caution:  This type of medication may cause sleepiness.  Do not drink alcohol, drive, or operate dangerous machinery while taking antihistamines.  Do not take these medications if you have prostate enlargement.  Read package instructions thoroughly on all medications that you take.  GET HELP RIGHT AWAY IF:  Symptoms don't go away after treatment. Severe itching that persists. If you rash spreads or swells. If you rash begins to smell. If it blisters and opens or develops a yellow-brown crust. You develop a fever. You have a sore throat. You become short of breath.  MAKE SURE YOU:  Understand these instructions. Will watch your condition. Will get help right away if you are not  doing well or get worse.  Thank you for choosing an e-visit.  Your e-visit answers were reviewed by a board certified advanced clinical practitioner to complete your personal care plan. Depending upon the condition, your plan could have included both over the counter or prescription medications.  Please review your pharmacy choice. Make sure the pharmacy is open so you can pick up prescription now. If there is a problem, you may contact your provider through MyChart messaging and have the prescription routed to another pharmacy.  Your safety is important to us. If you have drug allergies check your prescription carefully.   For the next 24 hours you can use MyChart to ask questions about today's visit, request a non-urgent call back, or ask for a work or school excuse. You will get an email in the next two days asking about your experience. I hope that your e-visit has been valuable and will speed your recovery.  

## 2021-05-03 ENCOUNTER — Telehealth: Payer: 59 | Admitting: Nurse Practitioner

## 2021-05-03 DIAGNOSIS — L739 Follicular disorder, unspecified: Secondary | ICD-10-CM

## 2021-05-03 MED ORDER — MUPIROCIN CALCIUM 2 % EX CREA: 1.0000 "application " | TOPICAL_CREAM | Freq: Two times a day (BID) | CUTANEOUS | 0 refills | Status: AC

## 2021-05-03 NOTE — Progress Notes (Signed)
?  E Visit for Folliculitis ? ? ?We are sorry you are not feeling well.  Here is how we plan to help! ? ?Based on what you have shared with me it looks like you have folliculitis.  Folliculitis refers to inflammation of the superficial or deep portio of the hair follicle.  It can be infectious or non-infectious. Various bacteria, fungi, viruses, and parasites can cause infectious folliculis ? ?Topical mupiricin will be prescribed for you today to apply directly to the affected area ? ? ? ?HOME CARE: ?Apply a warm, moist washcloth or compress using a saltwater solution (1 teaspoon of table salt to 2 cups water) ?Apply over the counter antibiotic cream, gel or wash ? ?Apply soothing lotions such as oatmeal lotion or over the counter hydrocortisone cream ?Clean the affected skin twice daily with antibacterial soap. Use clean washcloth and towel each time and do not share with anyone.  Wash these items and clothes that have touched the area with hot soapy water. ?Protect the skin. If possible avoid shaving.  If you must shave, try an Neurosurgeon.  When done, rinse skin with warm water and apply moisturizer. ? ?GET HELP RIGHT AWAY IF: ?You have extensive skin involvement or the symptoms return after treatment ?Symptoms don't go away after treatment. ?Severe itching that persists. ?If you rash spreads or swells. ?If you rash begins to smell. ?If it blisters and opens or develops a yellow-brown crust. ?You develop a fever. ?You have a sore throat. ?You become short of breath. ? ?MAKE SURE YOU: ? ?Understand these instructions. ?Will watch your condition. ?Will get help right away if you are not doing well or get worse. ? ?Thank you for choosing an e-visit. ? ?Your e-visit answers were reviewed by a board certified advanced clinical practitioner to complete your personal care plan. Depending upon the condition, your plan could have included both over the counter or prescription medications. ? ?Please review your pharmacy  choice. Make sure the pharmacy is open so you can pick up prescription now. If there is a problem, you may contact your provider through [employer] and have the prescription routed to another pharmacy.  Your safety is important to [place]. If you have drug allergies check your prescription carefully.  ? ?For the next 24 hours you can use MyChart to ask questions about today's visit, request a non-urgent call back, or ask for a work or school excuse. ?You will get an email in the next two days asking about your experience. I hope that your e-visit has been valuable and will speed your recovery.  ?

## 2021-05-03 NOTE — Progress Notes (Signed)
I have spent 5 minutes in review of e-visit questionnaire, review and updating patient chart, medical decision making and response to patient.  ° °Amadu Schlageter W Dung Prien, NP ° °  °

## 2021-05-23 ENCOUNTER — Telehealth: Payer: 59 | Admitting: Family Medicine

## 2021-05-23 DIAGNOSIS — R21 Rash and other nonspecific skin eruption: Secondary | ICD-10-CM

## 2021-05-23 MED ORDER — PREDNISONE 10 MG PO TABS: 10.0000 mg | ORAL_TABLET | Freq: Every day | ORAL | 0 refills | Status: AC

## 2021-05-23 NOTE — Progress Notes (Signed)
E Visit for Rash ? ?We are sorry that you are not feeling well. Here is how we plan to help! ? ? ?Prednisone 10 mg daily for 6 days (see taper instructions below) ? ? ?HOME CARE: ? ?Take cool showers and avoid direct sunlight. ?Apply cool compress or wet dressings. ?Take a bath in an oatmeal bath.  Sprinkle content of one Aveeno packet under running faucet with comfortably warm water.  Bathe for 15-20 minutes, 1-2 times daily.  Pat dry with a towel. Do not rub the rash. ?Use hydrocortisone cream. ?Take an antihistamine like Benadryl for widespread rashes that itch.  The adult dose of Benadryl is 25-50 mg by mouth 4 times daily. ?Caution:  This type of medication may cause sleepiness.  Do not drink alcohol, drive, or operate dangerous machinery while taking antihistamines.  Do not take these medications if you have prostate enlargement.  Read package instructions thoroughly on all medications that you take. ? ?GET HELP RIGHT AWAY IF: ? ?Symptoms don't go away after treatment. ?Severe itching that persists. ?If you rash spreads or swells. ?If you rash begins to smell. ?If it blisters and opens or develops a yellow-brown crust. ?You develop a fever. ?You have a sore throat. ?You become short of breath. ? ?MAKE SURE YOU: ? ?Understand these instructions. ?Will watch your condition. ?Will get help right away if you are not doing well or get worse. ? ?Thank you for choosing an e-visit. ? ?Your e-visit answers were reviewed by a board certified advanced clinical practitioner to complete your personal care plan. Depending upon the condition, your plan could have included both over the counter or prescription medications. ? ?Please review your pharmacy choice. Make sure the pharmacy is open so you can pick up prescription now. If there is a problem, you may contact your provider through [employer] and have the prescription routed to another pharmacy.  Your safety is important to [place]. If you have drug allergies check  your prescription carefully.  ? ?For the next 24 hours you can use MyChart to ask questions about today's visit, request a non-urgent call back, or ask for a work or school excuse. ?You will get an email in the next two days asking about your experience. I hope that your e-visit has been valuable and will speed your recovery.  ? ? have provided 5 minutes of non face to face time during this encounter for chart review and documentation.   ?

## 2021-05-28 ENCOUNTER — Telehealth: Payer: 59 | Admitting: Physician Assistant

## 2021-05-28 DIAGNOSIS — L298 Other pruritus: Secondary | ICD-10-CM

## 2021-05-28 NOTE — Progress Notes (Signed)
Based on what you shared with me, I feel your condition warrants further evaluation and I recommend that you be seen in a face to face visit. ? ?Giving continued symptoms despite various treatments through e-visit, you need an in-person evaluation and examination to determine next best steps in treatment.  ?  ?NOTE: There will be NO CHARGE for this eVisit ?  ?If you are having a true medical emergency please call 911.   ?  ? For an urgent face to face visit, [place] has six urgent care centers for your convenience:  ?  ? [facility] ?Get Driving Directions ?[phone] ?[phone] [address] ?[place], [place] [address] ?  ? [facility]) ?Get Driving Directions ?[phone] ?[address] ?[place], [place] [address] ? ?[facility]) ?Get Driving Directions ?[phone] ?[address] ?[place],  [place]  [address] ? ?[facility] ?Get Driving Directions ?[phone] ?[address] ?[place], [place] [address] ?  ?[facility] [place] ?Get Driving Directions  ?[phone] ?[address] ?[address] ?[place], [place] [address] ?  ?[facility] ?Get Driving Directions ?[phone] ?[address] ?[place], [place] [address] ? ?Your MyChart E-visit questionnaire answers were reviewed by a board certified advanced clinical practitioner to complete your personal care plan based on your specific symptoms.  Thank you for using e-Visits. ?  ? ?

## 2021-11-15 ENCOUNTER — Telehealth: Payer: 59 | Admitting: Nurse Practitioner

## 2021-11-15 DIAGNOSIS — N5089 Other specified disorders of the male genital organs: Secondary | ICD-10-CM

## 2021-11-15 NOTE — Progress Notes (Signed)
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 You will need a face to face visit. You will need an ultra sund of scrtom and groin to determine what this is.,that should be evaluated in a face to face office visit.   NOTE: There will be NO CHARGE for this eVisit   If you are having a true medical emergency please call 911.      For an urgent face to face visit, [place] has six urgent care centers for your convenience:     [facility] [place] Get Driving Directions [phone] [place], [place] [address]    [facility]) Get Driving Directions [phone] [place], [place] [address]  [facility]) Get Driving Directions [phone] [address] [place],  [place]  [address]  [facility] Get Driving Directions [phone] [address] [place], [place], [place] [address]   [facility] [place] Get Driving Directions  [phone] [address], [place] [address]   [facility]  Get Driving Directions [phone] [address], [place], [place] [address]  Your MyChart E-visit questionnaire answers were reviewed by a board certified advanced clinical practitioner to complete your personal care plan based on your specific symptoms.  Thank you for using e-Visits.

## 2021-11-27 ENCOUNTER — Encounter: Payer: Self-pay | Admitting: Urology

## 2021-11-27 ENCOUNTER — Ambulatory Visit: Payer: Self-pay | Admitting: Urology

## 2022-02-11 ENCOUNTER — Encounter: Payer: Commercial Managed Care - PPO | Admitting: Urology
